# Patient Record
Sex: Female | Born: 1955
Health system: Southern US, Community
[De-identification: ages and names within clinical notes are randomized; demographics above are authoritative.]

## PROBLEM LIST (undated history)

## (undated) DIAGNOSIS — Z720 Tobacco use: Secondary | ICD-10-CM

## (undated) DIAGNOSIS — I219 Acute myocardial infarction, unspecified: Secondary | ICD-10-CM

## (undated) DIAGNOSIS — R55 Syncope and collapse: Secondary | ICD-10-CM

## (undated) DIAGNOSIS — E509 Vitamin A deficiency, unspecified: Secondary | ICD-10-CM

## (undated) DIAGNOSIS — R519 Headache, unspecified: Secondary | ICD-10-CM

## (undated) DIAGNOSIS — I2510 Atherosclerotic heart disease of native coronary artery without angina pectoris: Secondary | ICD-10-CM

## (undated) DIAGNOSIS — I10 Essential (primary) hypertension: Secondary | ICD-10-CM

## (undated) DIAGNOSIS — E785 Hyperlipidemia, unspecified: Secondary | ICD-10-CM

## (undated) DIAGNOSIS — M62838 Other muscle spasm: Secondary | ICD-10-CM

## (undated) HISTORY — DX: Essential (primary) hypertension: I10

## (undated) HISTORY — PX: BREAST SURGERY: SHX581

## (undated) HISTORY — DX: Hyperlipidemia, unspecified: E78.5

## (undated) HISTORY — DX: Atherosclerotic heart disease of native coronary artery without angina pectoris: I25.10

## (undated) HISTORY — PX: SHOULDER SURGERY: SHX246

## (undated) HISTORY — DX: Headache, unspecified: R51.9

## (undated) HISTORY — DX: Other muscle spasm: M62.838

## (undated) HISTORY — DX: Vitamin a deficiency, unspecified: E50.9

## (undated) HISTORY — DX: Acute myocardial infarction, unspecified: I21.9

## (undated) HISTORY — PX: TONSILLECTOMY: SUR1361

## (undated) HISTORY — DX: Tobacco use: Z72.0

## (undated) HISTORY — DX: Syncope and collapse: R55

## (undated) HISTORY — PX: APPENDECTOMY: SHX54

---

## 1981-11-20 HISTORY — PX: ABDOMINAL HYSTERECTOMY: SHX81

## 1993-11-20 HISTORY — PX: CHOLECYSTECTOMY: SHX55

## 2009-11-20 DIAGNOSIS — I219 Acute myocardial infarction, unspecified: Secondary | ICD-10-CM

## 2009-11-20 HISTORY — DX: Acute myocardial infarction, unspecified: I21.9

## 2017-11-29 ENCOUNTER — Ambulatory Visit: Payer: Self-pay | Admitting: Emergency Medicine

## 2017-11-29 VITALS — BP 156/102 | HR 77 | Temp 98.4°F | Resp 22 | Wt 264.0 lb

## 2017-11-29 DIAGNOSIS — Z760 Encounter for issue of repeat prescription: Secondary | ICD-10-CM

## 2017-11-29 DIAGNOSIS — I10 Essential (primary) hypertension: Secondary | ICD-10-CM

## 2017-11-29 DIAGNOSIS — I499 Cardiac arrhythmia, unspecified: Secondary | ICD-10-CM

## 2017-11-29 MED ORDER — METOPROLOL SUCCINATE ER 25 MG PO TB24: 25.0000 mg | ORAL_TABLET | Freq: Every day | ORAL | 0 refills | Status: DC

## 2017-11-29 MED ORDER — HYDROCHLOROTHIAZIDE 12.5 MG PO TABS: 12.5000 mg | ORAL_TABLET | Freq: Every day | ORAL | 0 refills | Status: DC

## 2017-11-29 MED ORDER — HYDROXYZINE HCL 50 MG PO TABS: 50.0000 mg | ORAL_TABLET | Freq: Three times a day (TID) | ORAL | 0 refills | Status: DC | PRN

## 2017-11-29 MED ORDER — LISINOPRIL 10 MG PO TABS: 10.0000 mg | ORAL_TABLET | Freq: Every day | ORAL | 0 refills | Status: DC

## 2017-11-29 NOTE — Patient Instructions (Signed)
Hypertension Hypertension is another name for high blood pressure. High blood pressure forces your heart to work harder to pump blood. This can cause problems over time. There are two numbers in a blood pressure reading. There is a top number (systolic) over a bottom number (diastolic). It is best to have a blood pressure below 120/80. Healthy choices can help lower your blood pressure. You may need medicine to help lower your blood pressure if:  Your blood pressure cannot be lowered with healthy choices.  Your blood pressure is higher than 130/80.  Follow these instructions at home: Eating and drinking  If directed, follow the DASH eating plan. This diet includes: ? Filling half of your plate at each meal with fruits and vegetables. ? Filling one quarter of your plate at each meal with whole grains. Whole grains include whole wheat pasta, brown rice, and whole grain bread. ? Eating or drinking low-fat dairy products, such as skim milk or low-fat yogurt. ? Filling one quarter of your plate at each meal with low-fat (lean) proteins. Low-fat proteins include fish, skinless chicken, eggs, beans, and tofu. ? Avoiding fatty meat, cured and processed meat, or chicken with skin. ? Avoiding premade or processed food.  Eat less than 1,500 mg of salt (sodium) a day.  Limit alcohol use to no more than 1 drink a day for nonpregnant women and 2 drinks a day for men. One drink equals 12 oz of beer, 5 oz of wine, or 1 oz of hard liquor. Lifestyle  Work with your doctor to stay at a healthy weight or to lose weight. Ask your doctor what the best weight is for you.  Get at least 30 minutes of exercise that causes your heart to beat faster (aerobic exercise) most days of the week. This may include walking, swimming, or biking.  Get at least 30 minutes of exercise that strengthens your muscles (resistance exercise) at least 3 days a week. This may include lifting weights or pilates.  Do not use any  products that contain nicotine or tobacco. This includes cigarettes and e-cigarettes. If you need help quitting, ask your doctor.  Check your blood pressure at home as told by your doctor.  Keep all follow-up visits as told by your doctor. This is important. Medicines  Take over-the-counter and prescription medicines only as told by your doctor. Follow directions carefully.  Do not skip doses of blood pressure medicine. The medicine does not work as well if you skip doses. Skipping doses also puts you at risk for problems.  Ask your doctor about side effects or reactions to medicines that you should watch for. Contact a doctor if:  You think you are having a reaction to the medicine you are taking.  You have headaches that keep coming back (recurring).  You feel dizzy.  You have swelling in your ankles.  You have trouble with your vision. Get help right away if:  You get a very bad headache.  You start to feel confused.  You feel weak or numb.  You feel faint.  You get very bad pain in your: ? Chest. ? Belly (abdomen).  You throw up (vomit) more than once.  You have trouble breathing. Summary  Hypertension is another name for high blood pressure.  Making healthy choices can help lower blood pressure. If your blood pressure cannot be controlled with healthy choices, you may need to take medicine. This information is not intended to replace advice given to you by your health care  provider. Make sure you discuss any questions you have with your health care provider. Document Released: 04/24/2008 Document Revised: 10/04/2016 Document Reviewed: 10/04/2016 Elsevier Interactive Patient Education  [employer]2018 [employer] Medicine Refill at the Emergency Department We have refilled your medicine today, but it is best for you to get refills through your primary health care provider's office. In the future, please plan ahead so you do not need to get refills from the emergency  department. If the medicine we refilled was a maintenance medicine, you may have received only enough to get you by until you are able to see your regular health care provider. This information is not intended to replace advice given to you by your health care provider. Make sure you discuss any questions you have with your health care provider. Document Released: 02/23/2004 Document Revised: 10/20/2016 Document Reviewed: 02/13/2014 Elsevier Interactive Patient Education  2018 [employer]

## 2017-11-29 NOTE — Progress Notes (Signed)
° °  [id number]  Arrival Time:    SUBJECTIVE:  Deanna Dudley is a 62 y.o. female who presents to [facility] for a medication refill. 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 She has an appointment with a new PCP at the end of January to establish for primary care, previous [place] in [place]. She is requesting her BP medications and hydroxazine. She reports to be well controlled on these medications without previous known complications. Has no acute symptoms and no acute complaints, comprehensive ROS negative.    Allergies  Allergen Reactions   Demerol [Meperidine]    Imitrex [Sumatriptan]    Morphine And Related       ROS: As per HPI, remainder of ROS negative.   OBJECTIVE:   Vitals:   11/29/17 1135  BP: (!) 156/102  Pulse: 77  Resp: (!) 22  Temp: 98.4 F (36.9 C)  TempSrc: Oral  SpO2: 96%  Weight: 264 lb (119.7 kg)     General appearance: alert; no distress Eyes: PERRL; EOMI; conjunctiva normal HENT: normocephalic; atraumatic;  oral mucosa normal Neck: supple, no jvd Lungs: clear to auscultation bilaterally Heart: regular rate and rhythm Abdomen: soft, non-tender;  Back: no CVA tenderness Extremities: no cyanosis or edema; symmetrical with no gross deformities Skin: warm and dry Neurologic: normal gait; grossly normal, cranial nerves II-XII grossly intact Psychological: alert and cooperative; normal mood and affect     ASSESSMENT & PLAN:  1. Medication refill   2. Essential hypertension   3. Irregular heart rhythm     Meds ordered this encounter  Medications   lisinopril (PRINIVIL,ZESTRIL) 10 MG tablet    Sig: Take 1 tablet (10 mg total) by mouth daily.    Dispense:  90 tablet    Refill:  0    Order Specific Question:   Supervising Provider    Answer:   Stacie GlazeJENKINS, JOHN E [[id number]]   metoprolol succinate (TOPROL-XL) 25 MG 24 hr tablet    Sig: Take 1 tablet (25 mg total) by mouth daily.    Dispense:  90 tablet     Refill:  0    Order Specific Question:   Supervising Provider    Answer:   Stacie GlazeJENKINS, JOHN E [[id number]]   hydrOXYzine (ATARAX/VISTARIL) 50 MG tablet    Sig: Take 1 tablet (50 mg total) by mouth 3 (three) times daily as needed.    Dispense:  30 tablet    Refill:  0    Order Specific Question:   Supervising Provider    Answer:   Stacie GlazeJENKINS, JOHN E [[id number]]   hydrochlorothiazide (HYDRODIURIL) 12.5 MG tablet    Sig: Take 1 tablet (12.5 mg total) by mouth daily.    Dispense:  90 tablet    Refill:  0    Order Specific Question:   Supervising Provider    Answer:   Stacie GlazeJENKINS, JOHN E [phone][[id number]]   Will provide a one time refill, recommend follow up with a PCP for further management, counseling to go to the ER if she develops concerning symptoms. Reviewed expectations re: course of current medical issues. Questions answered. Outlined signs and symptoms indicating need for more acute intervention. Patient verbalized understanding. After Visit Summary given.

## 2017-12-18 ENCOUNTER — Ambulatory Visit (INDEPENDENT_AMBULATORY_CARE_PROVIDER_SITE_OTHER): Payer: 59 | Admitting: Family Medicine

## 2017-12-18 ENCOUNTER — Encounter: Payer: Self-pay | Admitting: Family Medicine

## 2017-12-18 VITALS — BP 142/94 | HR 80 | Temp 97.9°F | Resp 16 | Wt 264.0 lb

## 2017-12-18 DIAGNOSIS — E669 Obesity, unspecified: Secondary | ICD-10-CM | POA: Diagnosis not present

## 2017-12-18 DIAGNOSIS — I10 Essential (primary) hypertension: Secondary | ICD-10-CM | POA: Diagnosis not present

## 2017-12-18 DIAGNOSIS — Z131 Encounter for screening for diabetes mellitus: Secondary | ICD-10-CM

## 2017-12-18 DIAGNOSIS — I252 Old myocardial infarction: Secondary | ICD-10-CM | POA: Diagnosis not present

## 2017-12-18 DIAGNOSIS — R9431 Abnormal electrocardiogram [ECG] [EKG]: Secondary | ICD-10-CM

## 2017-12-18 LAB — POCT URINALYSIS DIP (DEVICE)
BILIRUBIN URINE: NEGATIVE
Glucose, UA: NEGATIVE mg/dL
Hgb urine dipstick: NEGATIVE
Ketones, ur: NEGATIVE mg/dL
NITRITE: NEGATIVE
PH: 6 (ref 5.0–8.0)
PROTEIN: NEGATIVE mg/dL
Specific Gravity, Urine: 1.015 (ref 1.005–1.030)
UROBILINOGEN UA: 1 mg/dL (ref 0.0–1.0)

## 2017-12-18 LAB — POCT GLYCOSYLATED HEMOGLOBIN (HGB A1C): HEMOGLOBIN A1C: 5.5

## 2017-12-18 MED ORDER — LISINOPRIL 10 MG PO TABS: 10.0000 mg | ORAL_TABLET | Freq: Every day | ORAL | 1 refills | Status: DC

## 2017-12-18 MED ORDER — METOPROLOL SUCCINATE ER 25 MG PO TB24: 50.0000 mg | ORAL_TABLET | Freq: Every day | ORAL | 1 refills | Status: DC

## 2017-12-18 NOTE — Progress Notes (Signed)
Patient ID: Deanna Dudley, female    DOB: 06/28/1956, 62 y.o.   MRN: [id number]  PCP: Bing NeighborsHarris, Deanna Strickler Dudley, Deanna Dudley  Chief Complaint  Patient presents with  . Establish Care    Subjective:  HPI Deanna Dudley is a 62 y.o. female presents to establish care today. 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 She is an employee of [employer].  She is a Designer, jewelleryregistered nurse.  Patient is accompanied by her husband and they are both attempting to report patient'Dudley health history at the same time which takes it difficult to ascertain when certain health events occurred as they are disagreeing about certain time frames.  She reports a history of multiple cardiovascular complaints. She describes the most recent episode which occurred in 2018 in which she experienced chest pain, shortness of breath, and became tachycardic [place] [place] and after waiting a while she signed and left AMA prior to any testing being completed.  She reports that she had an echocardi[place] and [place] in [place] however is unaware of what the results were.  She reports a known history of left ventricle hypertrophy secondary to an MI in June 2011 in which she underwent a heart cath with no interventions.  At some point she reports that she was seen by neurologist who had suspected that she had multiple sclerosis although she opted not to have an MRI performed due to cost.  At present she is negative for any neuromuscular dysfunction.  Other chronic issues include uncontrolled hypertension.  She admits to noncompliance with antihypertensive medications.  She has been off her meds for over a year.  She recently went to an urgent care to get medications filled after checking her blood pressure and it was greater than 150/90.  She is currently managed on  metoprolol 50 mg once daily and  hydrochlorothiazide 12.5 mg daily.  She denies any headaches, recent chest pain, shortness of breath, dizziness, or recent syncopal episodes.  She [place] and will attempt to retrieve a copy of her medical records.  She is sedentary and does not adhere to a DASH diet.  Social History   Socioeconomic History  . Marital status: Married    Spouse name: Not on file  . Number of children: Not on file  . Years of education: Not on file  . Highest education level: Not on file  Social Needs  . Financial resource strain: Not on file  . Food insecurity - worry: Not on file  . Food insecurity - inability: Not on file  . Transportation needs - medical: Not on file  . Transportation needs - non-medical: Not on file  Occupational History  . Not on file  Tobacco Use  . Smoking status: Never Smoker  . Smokeless tobacco: Never Used  Substance and Sexual Activity  . Alcohol use: Yes  . Drug use: No  . Sexual activity: Not on file  Other Topics Concern  . Not on file  Social History Narrative  . Not on file    Family History  Problem Relation Age of Onset  . Stroke Mother   . Colon cancer Mother   . Lung cancer Mother   . Hypothyroidism Father   . Diabetes Father      Review of Systems  Constitutional: Positive for fatigue.  HENT:  Negative.   Respiratory: Negative.   Cardiovascular: Negative.   Gastrointestinal: Negative.   Genitourinary: Negative.   Musculoskeletal: Negative.   Neurological: Positive for dizziness.       History of syncopal episode less than a year ago.  Hematological: Negative.   Psychiatric/Behavioral: Negative.      Allergies  Allergen Reactions  . Morphine And Related Shortness Of Breath  . Statins Other (See Comments)    Muscle cramps  . Demerol [Meperidine]   . Xanax [Alprazolam]     Prior to Admission medications   Medication Sig Start Date End Date Taking?  Authorizing Provider  hydrochlorothiazide (HYDRODIURIL) 12.5 MG tablet Take 12.5 mg by mouth daily. 11/29/17   [provider]  hydrOXYzine (ATARAX/VISTARIL) 50 MG tablet Take 50 mg by mouth 3 (three) times daily. 11/29/17   [provider]  lisinopril (PRINIVIL,ZESTRIL) 10 MG tablet Take 10 mg by mouth daily. 11/29/17   [provider]  metoprolol succinate (TOPROL-XL) 25 MG 24 hr tablet Take 25 mg by mouth daily. 11/29/17   [provider]    Past Medical, Surgical Family and Social History reviewed and updated.    Objective:   Today'Dudley Vitals   12/18/17 0928  BP: (!) 142/94  Pulse: 80  Resp: 16  Temp: 97.9 F (36.6 C)  TempSrc: Oral  SpO2: 98%  Weight: 264 lb (119.7 kg)    Wt Readings from Last 3 Encounters:  12/18/17 264 lb (119.7 kg)   Physical Exam Constitutional: Patient appears well-developed and well-nourished. No distress. HENT: Normocephalic, atraumatic, External right and left ear normal. Oropharynx is clear and moist.  Eyes: Conjunctivae and EOM are normal. PERRLA, no scleral icterus. Neck: Normal ROM. Neck supple. No JVD. No tracheal deviation. No thyromegaly. CVS: RRR, S1/S2 +, no murmurs, no gallops, no carotid bruit.  Pulmonary: Effort and breath sounds normal, no stridor, rhonchi, wheezes, rales.  Abdominal: Soft. BS +, no distension, tenderness, rebound or guarding.  Musculoskeletal: Normal range of motion. No edema and no tenderness.  Lymphadenopathy: No lymphadenopathy noted, cervical, inguinal or axillary Neuro: Alert. Normal strength,  muscle tone, and coordination.  Skin: Skin is warm and dry. No rash noted. Not diaphoretic. No erythema. No pallor. Psychiatric: Normal mood and affect. Behavior, judgment, thought content normal.   Assessment & Plan:  1. Essential hypertension She has not taken medication today prior to office visit.  We will continue on the same regimen and have patient return for blood pressure recheck  and follow-up within 6 weeks. We have discussed target BP range and blood pressure goal. I have advised patient to check BP regularly and to call us back or report to clinic if the numbers are consistently higher than 140/90. We discussed the importance of compliance with medical therapy and DASH diet recommended, consequences of uncontrolled hypertension discussed.   2. Screening for diabetes mellitus, 5.5, normal will repeat in 12 months.  3. History of myocardial infarction-patient reported.  Recent abnormal EKG shows left axis deviation.  She reports a syncopal episode occurring less than a year ago with accompanying shortness of breath and tachycardia.  Will refer to cardiology for further evaluation and workup.  Patient has a history of medication noncompliance with antihypertensive medication.  4. Obesity (BMI 30-39.9) Goal BMI  <25. Encouraged efforts to reduce weight include engaging in physical activity as tolerated with goal of 150 minutes per week. Improve dietary choices and eat a meal regimen consistent with a Mediterranean or DASH diet. Reduce simple carbohydrates. Do  not skip meals and eat healthy snacks throughout the day to avoid over-eating at dinner. Set a goal weight loss that is achievable for you.   5. Abnormal EKG, EKG significant for normal sinus rhythm with left axis deviation.  Has reported history of left ventricle hypertrophy and MI.  Referring patient for second opinion and further eval to cardiology.  Meds ordered this encounter  Medications  . metoprolol succinate (TOPROL-XL) 25 MG 24 hr tablet    Sig: Take 2 tablets (50 mg total) by mouth daily.    Dispense:  90 tablet    Refill:  1    Order Specific Question:   Supervising Provider    Answer:   Quentin Angst [id number]  . lisinopril (PRINIVIL,ZESTRIL) 10 MG tablet    Sig: Take 1 tablet (10 mg total) by mouth daily.    Dispense:  90 tablet    Refill:  1    Order Specific Question:   Supervising Provider     Answer:   Quentin Angst [id number]    Orders Placed This Encounter  Procedures  . Comprehensive metabolic panel  . CBC with Differential  . Ambulatory referral to Cardiology  . POCT glycosylated hemoglobin (Hb A1C)  . POCT urinalysis dip (device)  . EKG 12-Lead     Return for follow-up in 6 weeks of hypertension and to address overdue health maintenance items.     A total of 60 minutes spent, greater than 50 % of this time was spent reviewing prior medical history, reviewing medications and indications of treatment, prior labs and diagnostic tests, discussing current plan of treatment, health promotion, and goals of treatment.   Godfrey Pick. Tiburcio Pea, MSN, Deanna Dudley-C [facility]  [address] [place], [place] [address] [phone]

## 2017-12-18 NOTE — Patient Instructions (Addendum)
I have increased your metoprolol to 50 mg once daily to help control palpitations.   I will follow-up with you by phone after receiving your labs to evaluate whether or not your diuretic therapy should continue his hydrochlorothiazide or if maybe we should transition of furosemide.    Schedule follow-up lab appointment for fasting lipid panel.  I will see back in office in 6 weeks to follow-up on blood pressure any other concerns.   I will refer you to cardiology.  Please expect a call from heart care through [facility] for a visit to establish care for cardiovascular disease.    Hypertension Hypertension, commonly called high blood pressure, is when the force of blood pumping through the arteries is too strong. The arteries are the blood vessels that carry blood from the heart throughout the body. Hypertension forces the heart to work harder to pump blood and may cause arteries to become narrow or stiff. Having untreated or uncontrolled hypertension can cause heart attacks, strokes, kidney disease, and other problems. A blood pressure reading consists of a higher number over a lower number. Ideally, your blood pressure should be below 120/80. The first ("top") number is called the systolic pressure. It is a measure of the pressure in your arteries as your heart beats. The second ("bottom") number is called the diastolic pressure. It is a measure of the pressure in your arteries as the heart relaxes. What are the causes? The cause of this condition is not known. What increases the risk? Some risk factors for high blood pressure are under your control. Others are not. Factors you can change  Smoking.  Having type 2 diabetes mellitus, high cholesterol, or both.  Not getting enough exercise or physical activity.  Being overweight.  Having too much fat, sugar, calories, or salt (sodium) in your diet.  Drinking too much alcohol. Factors that are difficult or impossible to change  Having  chronic kidney disease.  Having a family history of high blood pressure.  Age. Risk increases with age.  Race. You may be at higher risk if you are African-American.  Gender. Men are at higher risk than women before age 58. After age 71, women are at higher risk than men.  Having obstructive sleep apnea.  Stress. What are the signs or symptoms? Extremely high blood pressure (hypertensive crisis) may cause:  Headache.  Anxiety.  Shortness of breath.  Nosebleed.  Nausea and vomiting.  Severe chest pain.  Jerky movements you cannot control (seizures).  How is this diagnosed? This condition is diagnosed by measuring your blood pressure while you are seated, with your arm resting on a surface. The cuff of the blood pressure monitor will be placed directly against the skin of your upper arm at the level of your heart. It should be measured at least twice using the same arm. Certain conditions can cause a difference in blood pressure between your right and left arms. Certain factors can cause blood pressure readings to be lower or higher than normal (elevated) for a short period of time:  When your blood pressure is higher when you are in a health care provider's office than when you are at home, this is called white coat hypertension. Most people with this condition do not need medicines.  When your blood pressure is higher at home than when you are in a health care provider's office, this is called masked hypertension. Most people with this condition may need medicines to control blood pressure.  If you have  a high blood pressure reading during one visit or you have normal blood pressure with other risk factors:  You may be asked to return on a different day to have your blood pressure checked again.  You may be asked to monitor your blood pressure at home for 1 week or longer.  If you are diagnosed with hypertension, you may have other blood or imaging tests to help your  health care provider understand your overall risk for other conditions. How is this treated? This condition is treated by making healthy lifestyle changes, such as eating healthy foods, exercising more, and reducing your alcohol intake. Your health care provider may prescribe medicine if lifestyle changes are not enough to get your blood pressure under control, and if:  Your systolic blood pressure is above 130.  Your diastolic blood pressure is above 80.  Your personal target blood pressure may vary depending on your medical conditions, your age, and other factors. Follow these instructions at home: Eating and drinking  Eat a diet that is high in fiber and potassium, and low in sodium, added sugar, and fat. An example eating plan is called the DASH (Dietary Approaches to Stop Hypertension) diet. To eat this way: ? Eat plenty of fresh fruits and vegetables. Try to fill half of your plate at each meal with fruits and vegetables. ? Eat whole grains, such as whole wheat pasta, brown rice, or whole grain bread. Fill about one quarter of your plate with whole grains. ? Eat or drink low-fat dairy products, such as skim milk or low-fat yogurt. ? Avoid fatty cuts of meat, processed or cured meats, and poultry with skin. Fill about one quarter of your plate with lean proteins, such as fish, chicken without skin, beans, eggs, and tofu. ? Avoid premade and processed foods. These tend to be higher in sodium, added sugar, and fat.  Reduce your daily sodium intake. Most people with hypertension should eat less than 1,500 mg of sodium a day.  Limit alcohol intake to no more than 1 drink a day for nonpregnant women and 2 drinks a day for men. One drink equals 12 oz of beer, 5 oz of wine, or 1 oz of hard liquor. Lifestyle  Work with your health care provider to maintain a healthy body weight or to lose weight. Ask what an ideal weight is for you.  Get at least 30 minutes of exercise that causes your heart  to beat faster (aerobic exercise) most days of the week. Activities may include walking, swimming, or biking.  Include exercise to strengthen your muscles (resistance exercise), such as pilates or lifting weights, as part of your weekly exercise routine. Try to do these types of exercises for 30 minutes at least 3 days a week.  Do not use any products that contain nicotine or tobacco, such as cigarettes and e-cigarettes. If you need help quitting, ask your health care provider.  Monitor your blood pressure at home as told by your health care provider.  Keep all follow-up visits as told by your health care provider. This is important. Medicines  Take over-the-counter and prescription medicines only as told by your health care provider. Follow directions carefully. Blood pressure medicines must be taken as prescribed.  Do not skip doses of blood pressure medicine. Doing this puts you at risk for problems and can make the medicine less effective.  Ask your health care provider about side effects or reactions to medicines that you should watch for. Contact a health care  provider if:  You think you are having a reaction to a medicine you are taking.  You have headaches that keep coming back (recurring).  You feel dizzy.  You have swelling in your ankles.  You have trouble with your vision. Get help right away if:  You develop a severe headache or confusion.  You have unusual weakness or numbness.  You feel faint.  You have severe pain in your chest or abdomen.  You vomit repeatedly.  You have trouble breathing. Summary  Hypertension is when the force of blood pumping through your arteries is too strong. If this condition is not controlled, it may put you at risk for serious complications.  Your personal target blood pressure may vary depending on your medical conditions, your age, and other factors. For most people, a normal blood pressure is less than 120/80.  Hypertension  is treated with lifestyle changes, medicines, or a combination of both. Lifestyle changes include weight loss, eating a healthy, low-sodium diet, exercising more, and limiting alcohol. This information is not intended to replace advice given to you by your health care provider. Make sure you discuss any questions you have with your health care provider. Document Released: 11/06/2005 Document Revised: 10/04/2016 Document Reviewed: 10/04/2016 Elsevier Interactive Patient Education  [employer]2018 [employer] Inc.

## 2017-12-19 LAB — COMPREHENSIVE METABOLIC PANEL
ALBUMIN: 4.3 g/dL (ref 3.6–4.8)
ALT: 13 IU/L (ref 0–32)
AST: 11 IU/L (ref 0–40)
Albumin/Globulin Ratio: 1.7 (ref 1.2–2.2)
Alkaline Phosphatase: 63 IU/L (ref 39–117)
BUN/Creatinine Ratio: 19 (ref 12–28)
BUN: 15 mg/dL (ref 8–27)
Bilirubin Total: 0.2 mg/dL (ref 0.0–1.2)
CALCIUM: 9.2 mg/dL (ref 8.7–10.3)
CHLORIDE: 104 mmol/L (ref 96–106)
CO2: 20 mmol/L (ref 20–29)
CREATININE: 0.8 mg/dL (ref 0.57–1.00)
GFR, EST AFRICAN AMERICAN: 92 mL/min/{1.73_m2} (ref >59)
GFR, EST NON AFRICAN AMERICAN: 80 mL/min/{1.73_m2} (ref >59)
Globulin, Total: 2.5 g/dL (ref 1.5–4.5)
Glucose: 89 mg/dL (ref 65–99)
Potassium: 3.9 mmol/L (ref 3.5–5.2)
Sodium: 143 mmol/L (ref 134–144)
TOTAL PROTEIN: 6.8 g/dL (ref 6.0–8.5)

## 2017-12-19 LAB — CBC WITH DIFFERENTIAL/PLATELET
BASOS ABS: 0.1 10*3/uL (ref 0.0–0.2)
BASOS: 1 %
EOS (ABSOLUTE): 0.4 10*3/uL (ref 0.0–0.4)
EOS: 4 %
HEMATOCRIT: 42.1 % (ref 34.0–46.6)
HEMOGLOBIN: 14.6 g/dL (ref 11.1–15.9)
IMMATURE GRANS (ABS): 0 10*3/uL (ref 0.0–0.1)
Immature Granulocytes: 0 %
LYMPHS ABS: 3.3 10*3/uL — AB (ref 0.7–3.1)
LYMPHS: 29 %
MCH: 31.2 pg (ref 26.6–33.0)
MCHC: 34.7 g/dL (ref 31.5–35.7)
MCV: 90 fL (ref 79–97)
MONOCYTES: 6 %
Monocytes Absolute: 0.6 10*3/uL (ref 0.1–0.9)
NEUTROS ABS: 6.8 10*3/uL (ref 1.4–7.0)
Neutrophils: 60 %
Platelets: 253 10*3/uL (ref 150–379)
RBC: 4.68 x10E6/uL (ref 3.77–5.28)
RDW: 13.8 % (ref 12.3–15.4)
WBC: 11.3 10*3/uL — ABNORMAL HIGH (ref 3.4–10.8)

## 2017-12-25 ENCOUNTER — Other Ambulatory Visit: Payer: Self-pay | Admitting: Family Medicine

## 2017-12-25 MED ORDER — METOPROLOL SUCCINATE ER 50 MG PO TB24: 50.0000 mg | ORAL_TABLET | Freq: Every day | ORAL | 1 refills | Status: DC

## 2017-12-25 MED FILL — METOPROLOL SUCC ER 50 MG TA: 50 | 90 days supply | Qty: 90 | Fill #0

## 2017-12-25 MED FILL — LISINOPRIL 10 MG TABLET: 10 | 90 days supply | Qty: 90 | Fill #0

## 2017-12-25 NOTE — Progress Notes (Signed)
Recent prescription for [facility]oprolol 50 mg once daily to [facility], patient wishes to obtain prescriptions from the pharmacy.

## 2017-12-31 ENCOUNTER — Telehealth: Payer: Self-pay

## 2017-12-31 NOTE — Telephone Encounter (Signed)
-----   Message from Bing NeighborsKimberly S Harris, FNP sent at 12/25/2017  7:45 AM EST ----- Contact patient to document her height. This information was not documented in the chart.

## 2017-12-31 NOTE — Telephone Encounter (Signed)
Left a vm for patient to callback 

## 2018-01-02 ENCOUNTER — Encounter: Payer: Self-pay | Admitting: Family Medicine

## 2018-01-02 ENCOUNTER — Ambulatory Visit (INDEPENDENT_AMBULATORY_CARE_PROVIDER_SITE_OTHER): Payer: 59 | Admitting: Family Medicine

## 2018-01-02 VITALS — BP 142/78 | HR 70 | Temp 97.4°F | Resp 14 | Ht 65.0 in | Wt 264.0 lb

## 2018-01-02 DIAGNOSIS — M5442 Lumbago with sciatica, left side: Secondary | ICD-10-CM

## 2018-01-02 DIAGNOSIS — Z136 Encounter for screening for cardiovascular disorders: Secondary | ICD-10-CM | POA: Diagnosis not present

## 2018-01-02 DIAGNOSIS — M5441 Lumbago with sciatica, right side: Secondary | ICD-10-CM

## 2018-01-02 LAB — POCT URINALYSIS DIP (DEVICE)
BILIRUBIN URINE: NEGATIVE
Glucose, UA: NEGATIVE mg/dL
Ketones, ur: NEGATIVE mg/dL
NITRITE: NEGATIVE
PH: 5.5 (ref 5.0–8.0)
Protein, ur: NEGATIVE mg/dL
Specific Gravity, Urine: <=1.005 (ref 1.005–1.030)
UROBILINOGEN UA: 0.2 mg/dL (ref 0.0–1.0)

## 2018-01-02 MED ORDER — PREDNISONE 20 MG PO TABS: 60.0000 mg | ORAL_TABLET | Freq: Every day | ORAL | 0 refills | Status: DC

## 2018-01-02 MED ORDER — LISINOPRIL 20 MG PO TABS: 20.0000 mg | ORAL_TABLET | Freq: Every day | ORAL | 3 refills | Status: DC

## 2018-01-02 MED ORDER — CYCLOBENZAPRINE HCL 10 MG PO TABS: 10.0000 mg | ORAL_TABLET | Freq: Three times a day (TID) | ORAL | 0 refills | Status: DC | PRN

## 2018-01-02 MED FILL — CYCLOBENZAPRINE 10 MG TAB: 10 | 10 days supply | Qty: 30 | Fill #0

## 2018-01-02 MED FILL — predniSONE 20 MG TABS: 20 | 5 days supply | Qty: 15 | Fill #0
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
## 2018-01-02 NOTE — Patient Instructions (Signed)
I am increasing your lisinopril 20 mg once daily.   For your sciatic back pain, I am prescribing you prednisone 60 mg once daily x 5 days with meals,  For muscle spasms, I am starting you on cyclobenzaprine 10 mg once daily up to 3 times daily as needed.-medication may cause drowsiness.     Sciatica Sciatica is pain, numbness, weakness, or tingling along the path of the sciatic nerve. The sciatic nerve starts in the lower back and runs down the back of each leg. The nerve controls the muscles in the lower leg and in the back of the knee. It also provides feeling (sensation) to the back of the thigh, the lower leg, and the sole of the foot. Sciatica is a symptom of another medical condition that pinches or puts pressure on the sciatic nerve. Generally, sciatica only affects one side of the body. Sciatica usually goes away on its own or with treatment. In some cases, sciatica may keep coming back (recur). What are the causes? This condition is caused by pressure on the sciatic nerve, or pinching of the sciatic nerve. This may be the result of:  A disk in between the bones of the spine (vertebrae) bulging out too far (herniated disk).  Age-related changes in the spinal disks (degenerative disk disease).  A pain disorder that affects a muscle in the buttock (piriformis syndrome).  Extra bone growth (bone spur) near the sciatic nerve.  An injury or break (fracture) of the pelvis.  Pregnancy.  Tumor (rare).  What increases the risk? The following factors may make you more likely to develop this condition:  Playing sports that place pressure or stress on the spine, such as football or weight lifting.  Having poor strength and flexibility.  A history of back injury.  A history of back surgery.  Sitting for long periods of time.  Doing activities that involve repetitive bending or lifting.  Obesity.  What are the signs or symptoms? Symptoms can vary from mild to very severe, and  they may include:  Any of these problems in the lower back, leg, hip, or buttock: ? Mild tingling or dull aches. ? Burning sensations. ? Sharp pains.  Numbness in the back of the calf or the sole of the foot.  Leg weakness.  Severe back pain that makes movement difficult.  These symptoms may get worse when you cough, sneeze, or laugh, or when you sit or stand for long periods of time. Being overweight may also make symptoms worse. In some cases, symptoms may recur over time. How is this diagnosed? This condition may be diagnosed based on:  Your symptoms.  A physical exam. Your health care provider may ask you to do certain movements to check whether those movements trigger your symptoms.  You may have tests, including: ? Blood tests. ? X-rays. ? MRI. ? CT scan.  How is this treated? In many cases, this condition improves on its own, without any treatment. However, treatment may include:  Reducing or modifying physical activity during periods of pain.  Exercising and stretching to strengthen your abdomen and improve the flexibility of your spine.  Icing and applying heat to the affected area.  Medicines that help: ? To relieve pain and swelling. ? To relax your muscles.  Injections of medicines that help to relieve pain, irritation, and inflammation around the sciatic nerve (steroids).  Surgery.  Follow these instructions at home: Medicines  Take over-the-counter and prescription medicines only as told by your health care  provider.  Do not drive or operate heavy machinery while taking prescription pain medicine. Managing pain  If directed, apply ice to the affected area. ? Put ice in a plastic bag. ? Place a towel between your skin and the bag. ? Leave the ice on for 20 minutes, 2-3 times a day.  After icing, apply heat to the affected area before you exercise or as often as told by your health care provider. Use the heat source that your health care provider  recommends, such as a moist heat pack or a heating pad. ? Place a towel between your skin and the heat source. ? Leave the heat on for 20-30 minutes. ? Remove the heat if your skin turns bright red. This is especially important if you are unable to feel pain, heat, or cold. You may have a greater risk of getting burned. Activity  Return to your normal activities as told by your health care provider. Ask your health care provider what activities are safe for you. ? Avoid activities that make your symptoms worse.  Take brief periods of rest throughout the day. Resting in a lying or standing position is usually better than sitting to rest. ? When you rest for longer periods, mix in some mild activity or stretching between periods of rest. This will help to prevent stiffness and pain. ? Avoid sitting for long periods of time without moving. Get up and move around at least one time each hour.  Exercise and stretch regularly, as told by your health care provider.  Do not lift anything that is heavier than 10 lb (4.5 kg) while you have symptoms of sciatica. When you do not have symptoms, you should still avoid heavy lifting, especially repetitive heavy lifting.  When you lift objects, always use proper lifting technique, which includes: ? Bending your knees. ? Keeping the load close to your body. ? Avoiding twisting. General instructions  Use good posture. ? Avoid leaning forward while sitting. ? Avoid hunching over while standing.  Maintain a healthy weight. Excess weight puts extra stress on your back and makes it difficult to maintain good posture.  Wear supportive, comfortable shoes. Avoid wearing high heels.  Avoid sleeping on a mattress that is too soft or too hard. A mattress that is firm enough to support your back when you sleep may help to reduce your pain.  Keep all follow-up visits as told by your health care provider. This is important. Contact a health care provider  if:  You have pain that wakes you up when you are sleeping.  You have pain that gets worse when you lie down.  Your pain is worse than you have experienced in the past.  Your pain lasts longer than 4 weeks.  You experience unexplained weight loss. Get help right away if:  You lose control of your bowel or bladder (incontinence).  You have: ? Weakness in your lower back, pelvis, buttocks, or legs that gets worse. ? Redness or swelling of your back. ? A burning sensation when you urinate. This information is not intended to replace advice given to you by your health care provider. Make sure you discuss any questions you have with your health care provider. Document Released: 10/31/2001 Document Revised: 04/11/2016 Document Reviewed: 07/16/2015 Elsevier Interactive Patient Education  [employer]2018 [employer] Inc.

## 2018-01-03 LAB — LIPID PANEL
CHOL/HDL RATIO: 5.5 ratio — AB (ref 0.0–4.4)
Cholesterol, Total: 182 mg/dL (ref 100–199)
HDL: 33 mg/dL — ABNORMAL LOW (ref >39)
LDL Calculated: 115 mg/dL — ABNORMAL HIGH (ref 0–99)
Triglycerides: 168 mg/dL — ABNORMAL HIGH (ref 0–149)
VLDL Cholesterol Cal: 34 mg/dL (ref 5–40)

## 2018-01-14 ENCOUNTER — Telehealth: Payer: Self-pay | Admitting: Family Medicine

## 2018-01-14 MED ORDER — GEMFIBROZIL 600 MG PO TABS: 600.0000 mg | ORAL_TABLET | Freq: Two times a day (BID) | ORAL | 1 refills | Status: DC

## 2018-01-14 NOTE — Telephone Encounter (Signed)
Phone line busy, can we send a letter

## 2018-01-14 NOTE — Telephone Encounter (Signed)
Contact patient to advise that her bad cholesterol and trigycerides were both elevated. Good cholesterol (HDL) is not at goal.  I would like to start her on Lopid 600 mg twice  Daily as she can't tolerate statin therapy. Encourage a Dash Diet along with increasing physical activity to improve overall cardiovascular health.   Godfrey PickKimberly S. Tiburcio PeaHarris, MSN, FNP-C [facility]  [address] [place], [place] [address] [phone]

## 2018-01-29 ENCOUNTER — Telehealth: Payer: Self-pay | Admitting: Family Medicine

## 2018-01-29 ENCOUNTER — Encounter: Payer: Self-pay | Admitting: Family Medicine

## 2018-01-29 ENCOUNTER — Ambulatory Visit (INDEPENDENT_AMBULATORY_CARE_PROVIDER_SITE_OTHER): Payer: 59 | Admitting: Family Medicine

## 2018-01-29 VITALS — BP 126/88 | HR 94 | Temp 98.0°F | Ht 65.0 in | Wt 257.0 lb

## 2018-01-29 DIAGNOSIS — Z87820 Personal history of traumatic brain injury: Secondary | ICD-10-CM

## 2018-01-29 DIAGNOSIS — M5432 Sciatica, left side: Secondary | ICD-10-CM

## 2018-01-29 DIAGNOSIS — R252 Cramp and spasm: Secondary | ICD-10-CM | POA: Diagnosis not present

## 2018-01-29 DIAGNOSIS — H9319 Tinnitus, unspecified ear: Secondary | ICD-10-CM

## 2018-01-29 DIAGNOSIS — M5431 Sciatica, right side: Secondary | ICD-10-CM

## 2018-01-29 DIAGNOSIS — R82998 Other abnormal findings in urine: Secondary | ICD-10-CM

## 2018-01-29 LAB — POCT URINALYSIS DIP (DEVICE)
Glucose, UA: NEGATIVE mg/dL
HGB URINE DIPSTICK: NEGATIVE
KETONES UR: 15 mg/dL — AB
NITRITE: NEGATIVE
Protein, ur: NEGATIVE mg/dL
SPECIFIC GRAVITY, URINE: 1.025 (ref 1.005–1.030)
Urobilinogen, UA: 0.2 mg/dL (ref 0.0–1.0)
pH: 5 (ref 5.0–8.0)

## 2018-01-29 MED ORDER — KETOROLAC TROMETHAMINE 30 MG/ML IJ SOLN
30.0000 mg | Freq: Once | INTRAMUSCULAR | Status: AC
  Administered 2018-01-29: 30 mg via INTRAMUSCULAR

## 2018-01-29 MED ORDER — HYDROXYZINE HCL 50 MG PO TABS: 50.0000 mg | ORAL_TABLET | Freq: Three times a day (TID) | ORAL | 2 refills | Status: DC

## 2018-01-29 MED ORDER — PREDNISONE 20 MG PO TABS: ORAL_TABLET | ORAL | 0 refills | Status: DC

## 2018-01-29 MED ORDER — HYDROCHLOROTHIAZIDE 12.5 MG PO TABS: 12.5000 mg | ORAL_TABLET | Freq: Every day | ORAL | 1 refills | Status: DC

## 2018-01-29 MED ORDER — DEXAMETHASONE SODIUM PHOSPHATE 10 MG/ML IJ SOLN
20.0000 mg | Freq: Once | INTRAMUSCULAR | Status: AC
  Administered 2018-01-29: 20 mg via INTRAMUSCULAR

## 2018-01-29 MED FILL — predniSONE 20 MG TABS: 20 | 9 days supply | Qty: 18 | Fill #0

## 2018-01-29 MED FILL — HYDROCHLOROTHIAZIDE 12.5 MG: 12.5 | 90 days supply | Qty: 90 | Fill #0

## 2018-01-29 MED FILL — hydrOXYzine HCL 50 MG TABS: 50 | 30 days supply | Qty: 90 | Fill #0

## 2018-01-29 NOTE — Telephone Encounter (Signed)
This has been scheduled for 01/31/2018@11am . Office had tried to contact patient however phone number in chart was incorrect. This has been updated. Thanks!

## 2018-01-29 NOTE — Telephone Encounter (Signed)
Vernona RiegerLaura,  Could you contact cardiology regarding the referral placed on 12/25/2017. I do not see that the patient has been scheduled for an appointment.   Godfrey PickKimberly S. Tiburcio PeaHarris, MSN, FNP-C [facility]  [address] [place], [place] [address] [phone]

## 2018-01-29 NOTE — Progress Notes (Signed)
Patient ID: Deanna Dudley Pavlock, female    DOB: 04/09/1956, 62 y.o.   MRN: [id number]  PCP: Bing NeighborsHarris, Julisa Flippo S, FNP  Chief Complaint  Patient presents with  . Follow-up    Htn  . Spasms    Subjective:  HPI Deanna Dudley Lantz is a 62 y.o. female with hypertension and muscle spasm, presents for evaluation of hypertension and new complaint of generalized body spasms. Reports body spasms are becoming concerning. Spasms are occurring on bilateral flanks, legs, below the rib cage. When cramps occur she can't move. The spasms are lasting 5-10 minutes.  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 She couldn't move for over 10 minutes. She characterizes pain as cramping and spasmatic. Attempted relief with muscle relaxer which relieves symptoms temporarily. She reports adherence with blood pressure medication. She has not routinely monitoring blood pressure. She is still waiting to follow-up with cardiology regarding intermittent chest pain. She continues to be inactive of physical activity. Denies headaches, dizziness, weakness, or shortness of breath.  Social History   Socioeconomic History  . Marital status: Married    Spouse name: Not on file  . Number of children: Not on file  . Years of education: Not on file  . Highest education level: Not on file  Social Needs  . Financial resource strain: Not on file  . Food insecurity - worry: Not on file  . Food insecurity - inability: Not on file  . Transportation needs - medical: Not on file  . Transportation needs - non-medical: Not on file  Occupational History  . Not on file  Tobacco Use  . Smoking status: Never Smoker  . Smokeless tobacco: Never Used  Substance and Sexual Activity  . Alcohol use: Yes  . Drug use: No  . Sexual activity: Not on file  Other Topics Concern  . Not on file  Social History Narrative  . Not on file    Family History  Problem Relation Age of Onset  . Stroke Mother   . Colon cancer Mother   .  Lung cancer Mother   . Hypothyroidism Father   . Diabetes Father    Review of Systems  Pertinent negatives indicated in HPI   Allergies  Allergen Reactions  . Morphine And Related Shortness Of Breath  . Statins Other (See Comments)    Muscle cramps  . Demerol [Meperidine]   . Xanax [Alprazolam]     Prior to Admission medications   Medication Sig Start Date End Date Taking? Authorizing Provider  aspirin 81 MG chewable tablet Chew by mouth daily.   Yes [provider]  cyclobenzaprine (FLEXERIL) 10 MG tablet Take 1 tablet (10 mg total) by mouth 3 (three) times daily as needed for muscle spasms. 01/02/18  Yes Bing NeighborsHarris, Brian Kocourek S, FNP  gemfibrozil (LOPID) 600 MG tablet Take 1 tablet (600 mg total) by mouth 2 (two) times daily before a meal. 01/14/18  Yes Bing NeighborsHarris, Finnbar Cedillos S, FNP  hydrochlorothiazide (HYDRODIURIL) 12.5 MG tablet Take 12.5 mg by mouth daily. 11/29/17  Yes [provider]  hydrOXYzine (ATARAX/VISTARIL) 50 MG tablet Take 50 mg by mouth 3 (three) times daily. 11/29/17  Yes [provider]  lisinopril (PRINIVIL,ZESTRIL) 20 MG tablet Take 1 tablet (20 mg total) by mouth daily. 01/02/18  Yes Bing NeighborsHarris, Camaria Gerald S, FNP  metoprolol succinate (TOPROL-XL) 50 MG 24 hr tablet Take 1 tablet (50 mg total) by mouth daily. 12/25/17  Yes Bing NeighborsHarris, Isacc Turney S, FNP  predniSONE (DELTASONE) 20 MG tablet Take  3 tablets (60 mg total) by mouth daily with breakfast. Patient not taking: Reported on 01/29/2018 01/02/18   Bing Neighbors, FNP    Past Medical, Surgical Family and Social History reviewed and updated.    Objective:   Today's Vitals   01/29/18 1019  BP: 126/88  Pulse: 94  Temp: 98 F (36.7 C)  TempSrc: Oral  SpO2: 99%  Weight: 257 lb (116.6 kg)  Height: 5\' 5"  (1.651 m)    Wt Readings from Last 3 Encounters:  01/29/18 257 lb (116.6 kg)  01/02/18 264 lb (119.7 kg)  12/18/17 264 lb (119.7 kg)   Physical Exam  Constitutional: She appears well-developed and  well-nourished.  HENT:  Head: Normocephalic and atraumatic.  Eyes: Pupils are equal, round, and reactive to light. Conjunctivae and EOM are normal.  Neck: Normal range of motion. Neck supple.  Cardiovascular: Normal rate, regular rhythm, normal heart sounds and intact distal pulses.  Pulmonary/Chest: Effort normal and breath sounds normal.  Musculoskeletal: She exhibits tenderness. She exhibits no edema.       Lumbar back: She exhibits spasm.  Neurological: Coordination normal.  Skin: Skin is warm and dry.  Psychiatric: Her mood appears anxious.   Assessment & Plan:  1. Bilateral sciatica, patient had an episode of spasmatic pain while leaving the office and escorted back into the exam room. Heat was applied to her back directly to area she experienced the pain. She was administered subcutaneous ketorolac and dexamethasone . Pain was relieved and patient was able to ambulate independently without assistance out of the office.  2. Ringing in ears, unspecified laterality 3. History of closed head injury, previously followed by neurology in [place] although relocated prior to receiving MRI. Continues to experience tinnitus and suspects this is related to head injury. Will refer- Ambulatory referral to Neurology for further work-up and evaluation   4. Spasm, unknown etiology. Checking  Vitamin D, 25-hydroxy, Vitamin B-12, Comprehensive metabolic panel, Sedimentation Rate, to rule out metabolic source of spasms. Referring to Neurology. - ketorolac (TORADOL) 30 MG/ML injection 30 mg - dexamethasone (DECADRON) injection 20 mg  5. Leukocytes in urine, check urine culture    Meds ordered this encounter  Medications  . DISCONTD: hydrochlorothiazide (HYDRODIURIL) 12.5 MG tablet    Sig: Take 1 tablet (12.5 mg total) by mouth daily.    Dispense:  90 tablet    Refill:  1    Order Specific Question:   Supervising Provider    Answer:   Quentin Angst [id number]  . hydrOXYzine  (ATARAX/VISTARIL) 50 MG tablet    Sig: Take 1 tablet (50 mg total) by mouth 3 (three) times daily.    Dispense:  90 tablet    Refill:  2    Order Specific Question:   Supervising Provider    Answer:   Quentin Angst [id number]  . predniSONE (DELTASONE) 20 MG tablet    Sig: Take 3 PO QAM x3days, 2 PO QAM x3days, 1 PO QAM x3days    Dispense:  18 tablet    Refill:  0    Order Specific Question:   Supervising Provider    Answer:   Quentin Angst [id number]  . ketorolac (TORADOL) 30 MG/ML injection 30 mg  . dexamethasone (DECADRON) injection 20 mg   Orders Placed This Encounter  Procedures  . Urine Culture  . DG Lumbar Spine Complete  . Vitamin D, 25-hydroxy  . Vitamin B12  . Comprehensive metabolic panel  . Sedimentation Rate  . Ambulatory  referral to Neurology  . POCT urinalysis dip (device)    RTC: 3 month for chronic condition follow-up.     Godfrey Pick. Tiburcio Pea, MSN, FNP-C [facility]  [address] [place], [place] [address] [phone]

## 2018-01-30 DIAGNOSIS — I10 Essential (primary) hypertension: Secondary | ICD-10-CM | POA: Insufficient documentation

## 2018-01-30 DIAGNOSIS — M62838 Other muscle spasm: Secondary | ICD-10-CM | POA: Insufficient documentation

## 2018-01-30 LAB — COMPREHENSIVE METABOLIC PANEL
ALT: 29 IU/L (ref 0–32)
AST: 20 IU/L (ref 0–40)
Albumin/Globulin Ratio: 1.5 (ref 1.2–2.2)
Albumin: 4.5 g/dL (ref 3.6–4.8)
Alkaline Phosphatase: 63 IU/L (ref 39–117)
BUN/Creatinine Ratio: 21 (ref 12–28)
BUN: 18 mg/dL (ref 8–27)
Bilirubin Total: 0.3 mg/dL (ref 0.0–1.2)
CALCIUM: 10.1 mg/dL (ref 8.7–10.3)
CO2: 20 mmol/L (ref 20–29)
Chloride: 100 mmol/L (ref 96–106)
Creatinine, Ser: 0.87 mg/dL (ref 0.57–1.00)
GFR calc Af Amer: 83 mL/min/{1.73_m2} (ref >59)
GFR, EST NON AFRICAN AMERICAN: 72 mL/min/{1.73_m2} (ref >59)
Globulin, Total: 3.1 g/dL (ref 1.5–4.5)
Glucose: 93 mg/dL (ref 65–99)
POTASSIUM: 4.9 mmol/L (ref 3.5–5.2)
Sodium: 138 mmol/L (ref 134–144)
Total Protein: 7.6 g/dL (ref 6.0–8.5)

## 2018-01-30 LAB — SEDIMENTATION RATE: SED RATE: 10 mm/h (ref 0–40)

## 2018-01-30 LAB — VITAMIN D 25 HYDROXY (VIT D DEFICIENCY, FRACTURES): VIT D 25 HYDROXY: 15.7 ng/mL — AB (ref 30.0–100.0)

## 2018-01-30 LAB — VITAMIN B12: Vitamin B-12: 930 pg/mL (ref 232–1245)

## 2018-01-31 ENCOUNTER — Encounter: Payer: Self-pay | Admitting: Physician Assistant

## 2018-01-31 ENCOUNTER — Ambulatory Visit (INDEPENDENT_AMBULATORY_CARE_PROVIDER_SITE_OTHER): Payer: 59 | Admitting: Physician Assistant

## 2018-01-31 ENCOUNTER — Other Ambulatory Visit: Payer: Self-pay | Admitting: Family Medicine

## 2018-01-31 VITALS — BP 112/86 | HR 84 | Ht 65.0 in | Wt 257.0 lb

## 2018-01-31 DIAGNOSIS — E785 Hyperlipidemia, unspecified: Secondary | ICD-10-CM

## 2018-01-31 DIAGNOSIS — Z01812 Encounter for preprocedural laboratory examination: Secondary | ICD-10-CM

## 2018-01-31 DIAGNOSIS — I200 Unstable angina: Secondary | ICD-10-CM

## 2018-01-31 DIAGNOSIS — I5031 Acute diastolic (congestive) heart failure: Secondary | ICD-10-CM | POA: Diagnosis not present

## 2018-01-31 DIAGNOSIS — R0683 Snoring: Secondary | ICD-10-CM | POA: Diagnosis not present

## 2018-01-31 DIAGNOSIS — I10 Essential (primary) hypertension: Secondary | ICD-10-CM | POA: Diagnosis not present

## 2018-01-31 DIAGNOSIS — Z720 Tobacco use: Secondary | ICD-10-CM

## 2018-01-31 LAB — URINE CULTURE

## 2018-01-31 MED ORDER — ROSUVASTATIN CALCIUM 10 MG PO TABS: 10.0000 mg | ORAL_TABLET | Freq: Every day | ORAL | 3 refills | Status: DC

## 2018-01-31 MED ORDER — HYDROCHLOROTHIAZIDE 25 MG PO TABS: 25.0000 mg | ORAL_TABLET | Freq: Every day | ORAL | 3 refills | Status: DC

## 2018-01-31 MED ORDER — NITROGLYCERIN 0.4 MG SL SUBL: 0.4000 mg | SUBLINGUAL_TABLET | SUBLINGUAL | 3 refills | Status: DC | PRN

## 2018-01-31 MED ORDER — VITAMIN D (ERGOCALCIFEROL) 1.25 MG (50000 UNIT) PO CAPS: 50000.0000 [IU] | ORAL_CAPSULE | ORAL | 0 refills | Status: DC

## 2018-01-31 MED FILL — ROSUVASTATIN CALCIUM 10 MG: 10 | 90 days supply | Qty: 90 | Fill #0

## 2018-01-31 MED FILL — VIT D2 1.25 MG (50,000 UNIT: 1.25 MG | 84 days supply | Qty: 12 | Fill #0

## 2018-01-31 MED FILL — NITROGLYCERIN 0.4 MG TAB SL: 0.4 | 5 days supply | Qty: 25 | Fill #0

## 2018-01-31 NOTE — Progress Notes (Signed)
Cardiology Office Note    Date:  01/31/2018   ID:  Deanna Dudley, DOB May 01, 1956, MRN [id number]  PCP:  Bing Neighbors, FNP  Cardiologist:  New to Dr. Anne Fu  Chief Complaint: Abnormal EKG  History of Present Illness:   Deanna Dudley is a 62 y.o. female with history of hypertension referred by Joaquin Courts, FNP for evaluation of abnormal EKG.  Recently dealing with bilateral muscle spasm on her back and leg.  Seen by PCP and prescribed muscle relaxant with improvement.  EKG done for screening for cardiovascular disease showed left axis deviation and referred for evaluation.  Personally reviewed recent lab work from PCP. 01/02/2018: Cholesterol, Total 182; HDL 33; LDL Calculated 115; Triglycerides 168  Normal electrolytes and kidney function.   The patient is a travel Engineer, civil (consulting).  Currently works at [employer] on [employer] [address].  The patient has a prior history of MI in 2011.  She underwent angioplasty only.  This was done at [facility] in [place].  History of syncope in 2007 with loss of consciousness.  She had normal echocardiogram and stress test afterwards.  No prior records available for review.  For the past few months she has been having intermittent substernal chest pressure with shortness of breath.  Symptoms spontaneously resolve in 5-10 minutes.  No radiation.  During her MI she only had a jaw pain.  No regular exercise.  She has chronic orthopnea and sleeps on multiple pillows.  Also has lower extremity edema.  Improved after increasing hydrochlorothiazide to 25 mg daily for past 2 days.  She snores a lot at night.  She had an myalgia on Lipitor previously.  Ongoing tobacco smoking.  She smokes 1 pack a day for past 28 years.  Daily alcohol use.  Father has history of CAD requiring CABG in early 79s.    Past Medical History:  Diagnosis Date  . CAD (coronary artery disease), native coronary artery    s/p Angioplast only in 2011 @ [place]  . Hyperlipidemia  LDL goal <70   . Hypertension   . Muscle spasm   . Myocardial infarction (HCC) 2011  . Syncope   . Tobacco abuse     Past Surgical History:  Procedure Laterality Date  . ABDOMINAL HYSTERECTOMY  1983  . APPENDECTOMY     Age 26  . BREAST SURGERY Bilateral    Removed ducts from both breast  . CHOLECYSTECTOMY  1995  . SHOULDER SURGERY Right    Rotater cuff repair  . TONSILLECTOMY     Age 51    Current Medications: Prior to Admission medications   Medication Sig Start Date End Date Taking? Authorizing Provider  aspirin 81 MG chewable tablet Chew by mouth daily.    [provider]  cyclobenzaprine (FLEXERIL) 10 MG tablet Take 1 tablet (10 mg total) by mouth 3 (three) times daily as needed for muscle spasms. 01/02/18   Bing Neighbors, FNP  gemfibrozil (LOPID) 600 MG tablet Take 1 tablet (600 mg total) by mouth 2 (two) times daily before a meal. 01/14/18   Bing Neighbors, FNP  hydrochlorothiazide (HYDRODIURIL) 12.5 MG tablet Take 1 tablet (12.5 mg total) by mouth daily. 01/29/18   Bing Neighbors, FNP  hydrOXYzine (ATARAX/VISTARIL) 50 MG tablet Take 1 tablet (50 mg total) by mouth 3 (three) times daily. 01/29/18   Bing Neighbors, FNP  lisinopril (PRINIVIL,ZESTRIL) 20 MG tablet Take 1 tablet (20 mg total) by mouth daily. 01/02/18   Joaquin Courts  S, FNP  metoprolol succinate (TOPROL-XL) 50 MG 24 hr tablet Take 1 tablet (50 mg total) by mouth daily. 12/25/17   Bing Neighbors, FNP  predniSONE (DELTASONE) 20 MG tablet Take 3 PO QAM x3days, 2 PO QAM x3days, 1 PO QAM x3days 01/29/18   Bing Neighbors, FNP    Allergies:   Morphine and related; Statins; Demerol [meperidine]; and Xanax [alprazolam]   Social History   Socioeconomic History  . Marital status: Married    Spouse name: None  . Number of children: None  . Years of education: None  . Highest education level: None  Social Needs  . Financial resource strain: None  . Food insecurity - worry: None  . Food  insecurity - inability: None  . Transportation needs - medical: None  . Transportation needs - non-medical: None  Occupational History  . None  Tobacco Use  . Smoking status: Never Smoker  . Smokeless tobacco: Never Used  Substance and Sexual Activity  . Alcohol use: Yes  . Drug use: No  . Sexual activity: None  Other Topics Concern  . None  Social History Narrative  . None     Family History:  The patient's family history includes CAD in her father; Clotting disorder in her maternal grandfather; Colon cancer in her mother; Diabetes in her father; Heart disease in her maternal grandfather and paternal grandmother; Hypothyroidism in her father; Lung cancer in her mother; Stroke in her mother.   ROS:   Please see the history of present illness.    ROS All other systems reviewed and are negative.   PHYSICAL EXAM:   VS:  BP 112/86 (BP Location: Right Arm, Patient Position: Sitting, Cuff Size: Large)   Pulse 84   Ht 5\' 5"  (1.651 m)   Wt 257 lb (116.6 kg)   SpO2 95%   BMI 42.77 kg/m    GEN: Well nourished, well developed, in no acute distress  HEENT: normal  Neck: no JVD, carotid bruits, or masses Cardiac: RRR; no murmurs, rubs, or gallops, 1 + edema  Respiratory:  clear to auscultation bilaterally, normal work of breathing GI: soft, nontender, nondistended, + BS MS: no deformity or atrophy  Skin: warm and dry, no rash Neuro:  Alert and Oriented x 3, Strength and sensation are intact Psych: euthymic mood, full affect  Wt Readings from Last 3 Encounters:  01/31/18 257 lb (116.6 kg)  01/29/18 257 lb (116.6 kg)  01/02/18 264 lb (119.7 kg)      Studies/Labs Reviewed:   EKG:  EKG is ordered today.  The ekg ordered today demonstrates NSR with Q wave inferiorlly  Recent Labs: 12/18/2017: Hemoglobin 14.6; Platelets 253 01/29/2018: ALT 29; BUN 18; Creatinine, Ser 0.87; Potassium 4.9; Sodium 138   Lipid Panel    Component Value Date/Time   CHOL 182 01/02/2018 0844   TRIG  168 (H) 01/02/2018 0844   HDL 33 (L) 01/02/2018 0844   CHOLHDL 5.5 (H) 01/02/2018 0844   LDLCALC 115 (H) 01/02/2018 0844    Additional studies/ records that were reviewed today include:   As summarized above:     ASSESSMENT & PLAN:    1. Unstable angina -Symptoms ongoing for past few months.  Recently worsened.  No regular exercise.  Given past medical history of MI, angioplasty, obesity, hypertension, hyperlipidemia and ongoing tobacco smoking>>> will proceed with cardiac catheterization.  Will add right-sided heart cath to evaluate pressure. -Continue aspirin and beta-blocker.  Add statin.  2. Presumed acute on chronic  diastolic heart failure -She has chronic orthopnea and lower extremity edema.  Her orthopnea could be due to sleep apnea.  Increase hydrochlorothiazide to 25 mg daily.  May consider echocardiogram after cath.  3.  Hypertension -Stable on current medication.  Increase hydrochlorothiazide to 25 mg as above.  Continue Toprol-XL 50 mg daily and lisinopril 20 mg daily.  Advised to keep a log of blood pressure.  4.  Hyperlipidemia - 01/02/2018: Cholesterol, Total 182; HDL 33; LDL Calculated 115; Triglycerides 168  -LDL goal less than 70.  She has myalgia on Lipitor previously. -She is agreed to start low-dose Crestor.  5.  Snoring -Patient declines sleep study.  6 Morbid obesity.  -She is working on weight loss.  She has lost 9 pounds in the past 10 days.  Encourage daily exercise.  7.  Tobacco smoking -Encourage cessation.  Education given.    Medication Adjustments/Labs and Tests Ordered: Current medicines are reviewed at length with the patient today.  Concerns regarding medicines are outlined above.  Medication changes, Labs and Tests ordered today are listed in the Patient Instructions below. Patient Instructions  Medication Instructions: Your physician has recommended you make the following change in your medication:  -1) START Rosuvastatin (Crestor) 10  mg - Take 1 tablet (10 mg) by mouth daily -2) INCREASE Hydrochlorothiazide (HCTZ) 25 mg - Take 1 tablet (25 mg) by mouth daily -3) NITROGLYCERIN - Place 1 tablet under your tongue every 5 min with a maximum dose of 3 as needed for chest pain  Labwork: Your physician has recommended that you have lab work today: BMET, CBC, PT/INR  Procedures/Testing: Your physician has requested that you have a cardiac catheterization. Cardiac catheterization is used to diagnose and/or treat various heart conditions. Doctors may recommend this procedure for a number of different reasons. The most common reason is to evaluate chest pain. Chest pain can be a symptom of coronary artery disease (CAD), and cardiac catheterization can show whether plaque is narrowing or blocking your heart's arteries. This procedure is also used to evaluate the valves, as well as measure the blood flow and oxygen levels in different parts of your heart. For further information please visit [email or web address] Please follow instruction sheet, as given.   Follow-Up: Your physician recommends that you schedule a follow-up appointment in: 4 WEEKS with Antionette PolesVin Bhaghat, PA-C  Your physician recommends that you schedule a follow-up appointment in: 2-3 MONTHS with Dr. Anne FuSkains.    [facility] [address], [address] [place] [address] Dept: [phone] Loc: [phone]  Harle StanfordCherry Delarocha  01/31/2018  You are scheduled for a Cardiac Catheterization on Wednesday, March 20 with Dr. Lance MussJayadeep Varanasi.  1. Please [facility] at [facility]: [address] [place], [place] [address] at 9:30 AM (two hours before your procedure to ensure your preparation). Free valet parking service is available.   Special note: Every effort is made to have your procedure done on time. Please understand that emergencies sometimes delay  scheduled procedures.  2. Diet: Do not eat or drink anything after midnight prior to your procedure except sips of water to take medications.  3. Labs: You will have labs done in office today  4. You may take all your regular morning medications with enough water to get them down safely. On the morning of your procedure, take your Aspirin  5. Plan for one night stay--bring personal belongings. 6. Bring  a current list of your medications and current insurance cards. 7. You MUST have a responsible person to drive you home. 8. Someone MUST be with you the first 24 hours after you arrive home or your discharge will be delayed. 9. Please wear clothes that are easy to get on and off and wear slip-on shoes.  Thank you for allowing [place] to care for you!   -- [facility]   If you need a refill on your cardiac medications before your next appointment, please call your pharmacy.      Lorelei Pont, [place]  01/31/2018 12:17 PM    [facility] [address] [place], [place], [place]  [address] Phone: [phone]; Fax: [phone]

## 2018-01-31 NOTE — Patient Instructions (Signed)
Medication Instructions: Your physician has recommended you make the following change in your medication:  -1) START Rosuvastatin (Crestor) 10 mg - Take 1 tablet (10 mg) by mouth daily -2) INCREASE Hydrochlorothiazide (HCTZ) 25 mg - Take 1 tablet (25 mg) by mouth daily -3) NITROGLYCERIN - Place 1 tablet under your tongue every 5 min with a maximum dose of 3 as needed for chest pain  Labwork: Your physician has recommended that you have lab work today: BMET, CBC, PT/INR  Procedures/Testing: Your physician has requested that you have a cardiac catheterization. Cardiac catheterization is used to diagnose and/or treat various heart conditions. Doctors may recommend this procedure for a number of different reasons. The most common reason is to evaluate chest pain. Chest pain can be a symptom of coronary artery disease (CAD), and cardiac catheterization can show whether plaque is narrowing or blocking your heart's arteries. This procedure is also used to evaluate the valves, as well as measure the blood flow and oxygen levels in different parts of your heart. For further information please visit [email or web address] Please follow instruction sheet, as given.   Follow-Up: Your physician recommends that you schedule a follow-up appointment in: 4 WEEKS with Antionette PolesVin Bhaghat, PA-C  Your physician recommends that you schedule a follow-up appointment in: 2-3 MONTHS with Dr. Anne FuSkains.    [facility] [address], [address] [place] [address] Dept: [phone] Loc: [phone]  Deanna Dudley  01/31/2018  You are scheduled for a Cardiac Catheterization on Wednesday, March 20 with Dr. Lance MussJayadeep Varanasi.  1. Please [facility] the [facility] at [facility]: [address] [place], [place] [address] at 9:30 AM (two hours before your procedure to ensure your preparation). Free valet parking service  is available.   Special note: Every effort is made to have your procedure done on time. Please understand that emergencies sometimes delay scheduled procedures.  2. Diet: Do not eat or drink anything after midnight prior to your procedure except sips of water to take medications.  3. Labs: You will have labs done in office today  4. You may take all your regular morning medications with enough water to get them down safely. On the morning of your procedure, take your Aspirin  5. Plan for one night stay--bring personal belongings. 6. Bring a current list of your medications and current insurance cards. 7. You MUST have a responsible person to drive you home. 8. Someone MUST be with you the first 24 hours after you arrive home or your discharge will be delayed. 9. Please wear clothes that are easy to get on and off and wear slip-on shoes.  Thank you for allowing us to care for you!   --  Invasive Cardiovascular services   If you need a refill on your cardiac medications before your next appointment, please call your pharmacy.

## 2018-01-31 NOTE — H&P (View-Only) (Signed)
Cardiology Office Note    Date:  01/31/2018   ID:  Deanna Dudley, DOB May 01, 1956, MRN [id number]  PCP:  Bing Neighbors, FNP  Cardiologist:  New to Dr. Anne Fu  Chief Complaint: Abnormal EKG  History of Present Illness:   Deanna Dudley is a 62 y.o. female with history of hypertension referred by Joaquin Courts, FNP for evaluation of abnormal EKG.  Recently dealing with bilateral muscle spasm on her back and leg.  Seen by PCP and prescribed muscle relaxant with improvement.  EKG done for screening for cardiovascular disease showed left axis deviation and referred for evaluation.  Personally reviewed recent lab work from PCP. 01/02/2018: Cholesterol, Total 182; HDL 33; LDL Calculated 115; Triglycerides 168  Normal electrolytes and kidney function.   The patient is a travel Engineer, civil (consulting).  Currently works at [employer] on [employer] [address].  The patient has a prior history of MI in 2011.  She underwent angioplasty only.  This was done at [facility] in [place].  History of syncope in 2007 with loss of consciousness.  She had normal echocardiogram and stress test afterwards.  No prior records available for review.  For the past few months she has been having intermittent substernal chest pressure with shortness of breath.  Symptoms spontaneously resolve in 5-10 minutes.  No radiation.  During her MI she only had a jaw pain.  No regular exercise.  She has chronic orthopnea and sleeps on multiple pillows.  Also has lower extremity edema.  Improved after increasing hydrochlorothiazide to 25 mg daily for past 2 days.  She snores a lot at night.  She had an myalgia on Lipitor previously.  Ongoing tobacco smoking.  She smokes 1 pack a day for past 28 years.  Daily alcohol use.  Father has history of CAD requiring CABG in early 79s.    Past Medical History:  Diagnosis Date  . CAD (coronary artery disease), native coronary artery    s/p Angioplast only in 2011 @ [place]  . Hyperlipidemia  LDL goal <70   . Hypertension   . Muscle spasm   . Myocardial infarction (HCC) 2011  . Syncope   . Tobacco abuse     Past Surgical History:  Procedure Laterality Date  . ABDOMINAL HYSTERECTOMY  1983  . APPENDECTOMY     Age 26  . BREAST SURGERY Bilateral    Removed ducts from both breast  . CHOLECYSTECTOMY  1995  . SHOULDER SURGERY Right    Rotater cuff repair  . TONSILLECTOMY     Age 51    Current Medications: Prior to Admission medications   Medication Sig Start Date End Date Taking? Authorizing Provider  aspirin 81 MG chewable tablet Chew by mouth daily.    [provider]  cyclobenzaprine (FLEXERIL) 10 MG tablet Take 1 tablet (10 mg total) by mouth 3 (three) times daily as needed for muscle spasms. 01/02/18   Bing Neighbors, FNP  gemfibrozil (LOPID) 600 MG tablet Take 1 tablet (600 mg total) by mouth 2 (two) times daily before a meal. 01/14/18   Bing Neighbors, FNP  hydrochlorothiazide (HYDRODIURIL) 12.5 MG tablet Take 1 tablet (12.5 mg total) by mouth daily. 01/29/18   Bing Neighbors, FNP  hydrOXYzine (ATARAX/VISTARIL) 50 MG tablet Take 1 tablet (50 mg total) by mouth 3 (three) times daily. 01/29/18   Bing Neighbors, FNP  lisinopril (PRINIVIL,ZESTRIL) 20 MG tablet Take 1 tablet (20 mg total) by mouth daily. 01/02/18   Joaquin Courts  S, FNP  metoprolol succinate (TOPROL-XL) 50 MG 24 hr tablet Take 1 tablet (50 mg total) by mouth daily. 12/25/17   Bing Neighbors, FNP  predniSONE (DELTASONE) 20 MG tablet Take 3 PO QAM x3days, 2 PO QAM x3days, 1 PO QAM x3days 01/29/18   Bing Neighbors, FNP    Allergies:   Morphine and related; Statins; Demerol [meperidine]; and Xanax [alprazolam]   Social History   Socioeconomic History  . Marital status: Married    Spouse name: None  . Number of children: None  . Years of education: None  . Highest education level: None  Social Needs  . Financial resource strain: None  . Food insecurity - worry: None  . Food  insecurity - inability: None  . Transportation needs - medical: None  . Transportation needs - non-medical: None  Occupational History  . None  Tobacco Use  . Smoking status: Never Smoker  . Smokeless tobacco: Never Used  Substance and Sexual Activity  . Alcohol use: Yes  . Drug use: No  . Sexual activity: None  Other Topics Concern  . None  Social History Narrative  . None     Family History:  The patient's family history includes CAD in her father; Clotting disorder in her maternal grandfather; Colon cancer in her mother; Diabetes in her father; Heart disease in her maternal grandfather and paternal grandmother; Hypothyroidism in her father; Lung cancer in her mother; Stroke in her mother.   ROS:   Please see the history of present illness.    ROS All other systems reviewed and are negative.   PHYSICAL EXAM:   VS:  BP 112/86 (BP Location: Right Arm, Patient Position: Sitting, Cuff Size: Large)   Pulse 84   Ht 5\' 5"  (1.651 m)   Wt 257 lb (116.6 kg)   SpO2 95%   BMI 42.77 kg/m    GEN: Well nourished, well developed, in no acute distress  HEENT: normal  Neck: no JVD, carotid bruits, or masses Cardiac: RRR; no murmurs, rubs, or gallops, 1 + edema  Respiratory:  clear to auscultation bilaterally, normal work of breathing GI: soft, nontender, nondistended, + BS MS: no deformity or atrophy  Skin: warm and dry, no rash Neuro:  Alert and Oriented x 3, Strength and sensation are intact Psych: euthymic mood, full affect  Wt Readings from Last 3 Encounters:  01/31/18 257 lb (116.6 kg)  01/29/18 257 lb (116.6 kg)  01/02/18 264 lb (119.7 kg)      Studies/Labs Reviewed:   EKG:  EKG is ordered today.  The ekg ordered today demonstrates NSR with Q wave inferiorlly  Recent Labs: 12/18/2017: Hemoglobin 14.6; Platelets 253 01/29/2018: ALT 29; BUN 18; Creatinine, Ser 0.87; Potassium 4.9; Sodium 138   Lipid Panel    Component Value Date/Time   CHOL 182 01/02/2018 0844   TRIG  168 (H) 01/02/2018 0844   HDL 33 (L) 01/02/2018 0844   CHOLHDL 5.5 (H) 01/02/2018 0844   LDLCALC 115 (H) 01/02/2018 0844    Additional studies/ records that were reviewed today include:   As summarized above:     ASSESSMENT & PLAN:    1. Unstable angina -Symptoms ongoing for past few months.  Recently worsened.  No regular exercise.  Given past medical history of MI, angioplasty, obesity, hypertension, hyperlipidemia and ongoing tobacco smoking>>> will proceed with cardiac catheterization.  Will add right-sided heart cath to evaluate pressure. -Continue aspirin and beta-blocker.  Add statin.  2. Presumed acute on chronic  diastolic heart failure -She has chronic orthopnea and lower extremity edema.  Her orthopnea could be due to sleep apnea.  Increase hydrochlorothiazide to 25 mg daily.  May consider echocardiogram after cath.  3.  Hypertension -Stable on current medication.  Increase hydrochlorothiazide to 25 mg as above.  Continue Toprol-XL 50 mg daily and lisinopril 20 mg daily.  Advised to keep a log of blood pressure.  4.  Hyperlipidemia - 01/02/2018: Cholesterol, Total 182; HDL 33; LDL Calculated 115; Triglycerides 168  -LDL goal less than 70.  She has myalgia on Lipitor previously. -She is agreed to start low-dose Crestor.  5.  Snoring -Patient declines sleep study.  6 Morbid obesity.  -She is working on weight loss.  She has lost 9 pounds in the past 10 days.  Encourage daily exercise.  7.  Tobacco smoking -Encourage cessation.  Education given.    Medication Adjustments/Labs and Tests Ordered: Current medicines are reviewed at length with the patient today.  Concerns regarding medicines are outlined above.  Medication changes, Labs and Tests ordered today are listed in the Patient Instructions below. Patient Instructions  Medication Instructions: Your physician has recommended you make the following change in your medication:  -1) START Rosuvastatin (Crestor) 10  mg - Take 1 tablet (10 mg) by mouth daily -2) INCREASE Hydrochlorothiazide (HCTZ) 25 mg - Take 1 tablet (25 mg) by mouth daily -3) NITROGLYCERIN - Place 1 tablet under your tongue every 5 min with a maximum dose of 3 as needed for chest pain  Labwork: Your physician has recommended that you have lab work today: BMET, CBC, PT/INR  Procedures/Testing: Your physician has requested that you have a cardiac catheterization. Cardiac catheterization is used to diagnose and/or treat various heart conditions. Doctors may recommend this procedure for a number of different reasons. The most common reason is to evaluate chest pain. Chest pain can be a symptom of coronary artery disease (CAD), and cardiac catheterization can show whether plaque is narrowing or blocking your heart's arteries. This procedure is also used to evaluate the valves, as well as measure the blood flow and oxygen levels in different parts of your heart. For further information please visit [email or web address]. Please follow instruction sheet, as given.   Follow-Up: Your physician recommends that you schedule a follow-up appointment in: 4 WEEKS with Antionette PolesVin Bhaghat, PA-C  Your physician recommends that you schedule a follow-up appointment in: 2-3 MONTHS with Dr. Anne FuSkains.    [facility] OFF[facility] [address], [address] [place] [address] Dept: [phone] Loc: [phone]  Harle StanfordCherry Delarocha  01/31/2018  You are scheduled for a Cardiac Catheterization on Wednesday, March 20 with Dr. Lance MussJayadeep Varanasi.  1. Please [facility] at [facility]: [address] [place], [place] [address] at 9:30 AM (two hours before your procedure to ensure your preparation). Free valet parking service is available.   Special note: Every effort is made to have your procedure done on time. Please understand that emergencies sometimes delay  scheduled procedures.  2. Diet: Do not eat or drink anything after midnight prior to your procedure except sips of water to take medications.  3. Labs: You will have labs done in office today  4. You may take all your regular morning medications with enough water to get them down safely. On the morning of your procedure, take your Aspirin  5. Plan for one night stay--bring personal belongings. 6. Bring  a current list of your medications and current insurance cards. 7. You MUST have a responsible person to drive you home. 8. Someone MUST be with you the first 24 hours after you arrive home or your discharge will be delayed. 9. Please wear clothes that are easy to get on and off and wear slip-on shoes.  Thank you for allowing [place] to care for you!   -- [facility]   If you need a refill on your cardiac medications before your next appointment, please call your pharmacy.      Lorelei Pont, [place]  01/31/2018 12:17 PM    [facility] [address] [place], [place], [place]  [address] Phone: [phone]; Fax: [phone]

## 2018-01-31 NOTE — Progress Notes (Signed)
Mychart message sent regarding recent labs.

## 2018-02-01 LAB — CBC WITH DIFFERENTIAL/PLATELET
BASOS ABS: 0 10*3/uL (ref 0.0–0.2)
Basos: 0 %
EOS (ABSOLUTE): 0 10*3/uL (ref 0.0–0.4)
Eos: 0 %
Hematocrit: 46.6 % (ref 34.0–46.6)
Hemoglobin: 16.2 g/dL — ABNORMAL HIGH (ref 11.1–15.9)
IMMATURE GRANS (ABS): 0 10*3/uL (ref 0.0–0.1)
Immature Granulocytes: 0 %
LYMPHS: 17 %
Lymphocytes Absolute: 2.6 10*3/uL (ref 0.7–3.1)
MCH: 32.2 pg (ref 26.6–33.0)
MCHC: 34.8 g/dL (ref 31.5–35.7)
MCV: 93 fL (ref 79–97)
Monocytes Absolute: 0.7 10*3/uL (ref 0.1–0.9)
Monocytes: 4 %
NEUTROS ABS: 12.1 10*3/uL — AB (ref 1.4–7.0)
NEUTROS PCT: 79 %
PLATELETS: 345 10*3/uL (ref 150–379)
RBC: 5.03 x10E6/uL (ref 3.77–5.28)
RDW: 13.8 % (ref 12.3–15.4)
WBC: 15.5 10*3/uL — AB (ref 3.4–10.8)

## 2018-02-01 LAB — BASIC METABOLIC PANEL
BUN/Creatinine Ratio: 22 (ref 12–28)
BUN: 22 mg/dL (ref 8–27)
CALCIUM: 10.7 mg/dL — AB (ref 8.7–10.3)
CHLORIDE: 95 mmol/L — AB (ref 96–106)
CO2: 23 mmol/L (ref 20–29)
Creatinine, Ser: 1.02 mg/dL — ABNORMAL HIGH (ref 0.57–1.00)
GFR calc non Af Amer: 60 mL/min/{1.73_m2} (ref >59)
GFR, EST AFRICAN AMERICAN: 69 mL/min/{1.73_m2} (ref >59)
GLUCOSE: 101 mg/dL — AB (ref 65–99)
POTASSIUM: 4.7 mmol/L (ref 3.5–5.2)
Sodium: 138 mmol/L (ref 134–144)

## 2018-02-01 LAB — PROTIME-INR
INR: 1 (ref 0.8–1.2)
Prothrombin Time: 10.2 s (ref 9.1–12.0)

## 2018-02-05 ENCOUNTER — Encounter: Payer: Self-pay | Admitting: Neurology

## 2018-02-05 ENCOUNTER — Ambulatory Visit (INDEPENDENT_AMBULATORY_CARE_PROVIDER_SITE_OTHER): Payer: 59 | Admitting: Neurology

## 2018-02-05 ENCOUNTER — Telehealth: Payer: Self-pay | Admitting: *Deleted

## 2018-02-05 VITALS — BP 127/76 | HR 80 | Ht 65.0 in | Wt 254.5 lb

## 2018-02-05 DIAGNOSIS — R202 Paresthesia of skin: Secondary | ICD-10-CM | POA: Diagnosis not present

## 2018-02-05 DIAGNOSIS — M62838 Other muscle spasm: Secondary | ICD-10-CM | POA: Diagnosis not present

## 2018-02-05 MED ORDER — GABAPENTIN 300 MG PO CAPS: 300.0000 mg | ORAL_CAPSULE | Freq: Three times a day (TID) | ORAL | 11 refills | Status: DC

## 2018-02-05 MED FILL — GABAPENTIN 300 MG CAPS: 300 | 30 days supply | Qty: 90 | Fill #0

## 2018-02-05 NOTE — Progress Notes (Signed)
Pt has been made aware of normal result and verbalized understanding.  jw 02/05/18

## 2018-02-05 NOTE — Telephone Encounter (Addendum)
Pt contacted pre-catheterization scheduled at [facility] ZOX:[id number]:Wednesday March 20,2019 11:30 AM Verified arrival [facility] and [facility] Tower at: 9 AM Nothing to eat or drink after midnight prior to cath. Verified allergies in Epic.  Verified no diabetes medications.   Hold:  HCTZ am of cath  Except hold medications AM meds can be  taken pre-cath with sip of water including: ASA 81 mg  Confirmed patient has responsible person to drive home post procedure and observe patient for 24 hours: yes  WBC 15.5 done 01/31/18--pt states she has been taking prednisone for back spasms, denies any recent illness or fever.
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
## 2018-02-06 ENCOUNTER — Telehealth: Payer: Self-pay | Admitting: Neurology

## 2018-02-06 ENCOUNTER — Ambulatory Visit (HOSPITAL_COMMUNITY)
Admission: RE | Disposition: A | Discharge status: Home / Self Care | Payer: Self-pay | Source: Ambulatory Visit | Attending: Interventional Cardiology

## 2018-02-06 ENCOUNTER — Ambulatory Visit (HOSPITAL_COMMUNITY)
Admission: RE | Admit: 2018-02-06 | Discharge: 2018-02-06 | Disposition: A | Discharge status: Home / Self Care | Payer: 59 | Source: Ambulatory Visit | Attending: Interventional Cardiology | Admitting: Interventional Cardiology

## 2018-02-06 DIAGNOSIS — I252 Old myocardial infarction: Secondary | ICD-10-CM | POA: Insufficient documentation

## 2018-02-06 DIAGNOSIS — F17210 Nicotine dependence, cigarettes, uncomplicated: Secondary | ICD-10-CM | POA: Insufficient documentation

## 2018-02-06 DIAGNOSIS — E785 Hyperlipidemia, unspecified: Secondary | ICD-10-CM | POA: Diagnosis not present

## 2018-02-06 DIAGNOSIS — Z7982 Long term (current) use of aspirin: Secondary | ICD-10-CM | POA: Diagnosis not present

## 2018-02-06 DIAGNOSIS — I25110 Atherosclerotic heart disease of native coronary artery with unstable angina pectoris: Secondary | ICD-10-CM | POA: Insufficient documentation

## 2018-02-06 DIAGNOSIS — Z8249 Family history of ischemic heart disease and other diseases of the circulatory system: Secondary | ICD-10-CM | POA: Insufficient documentation

## 2018-02-06 DIAGNOSIS — Z885 Allergy status to narcotic agent status: Secondary | ICD-10-CM | POA: Insufficient documentation

## 2018-02-06 DIAGNOSIS — I10 Essential (primary) hypertension: Secondary | ICD-10-CM | POA: Insufficient documentation

## 2018-02-06 DIAGNOSIS — Z6841 Body Mass Index (BMI) 40.0 and over, adult: Secondary | ICD-10-CM | POA: Diagnosis not present

## 2018-02-06 DIAGNOSIS — I209 Angina pectoris, unspecified: Secondary | ICD-10-CM | POA: Diagnosis not present

## 2018-02-06 HISTORY — PX: RIGHT/LEFT HEART CATH AND CORONARY ANGIOGRAPHY: CATH118266

## 2018-02-06 LAB — POCT I-STAT 3, ART BLOOD GAS (G3+)
Bicarbonate: 25.1 mmol/L (ref 20.0–28.0)
O2 SAT: 95 %
TCO2: 26 mmol/L (ref 22–32)
pCO2 arterial: 41.1 mmHg (ref 32.0–48.0)
pH, Arterial: 7.394 (ref 7.350–7.450)
pO2, Arterial: 74 mmHg — ABNORMAL LOW (ref 83.0–108.0)

## 2018-02-06 LAB — POCT I-STAT 3, VENOUS BLOOD GAS (G3P V)
BICARBONATE: 26.2 mmol/L (ref 20.0–28.0)
O2 SAT: 73 %
PO2 VEN: 41 mmHg (ref 32.0–45.0)
TCO2: 28 mmol/L (ref 22–32)
pCO2, Ven: 47.3 mmHg (ref 44.0–60.0)
pH, Ven: 7.351 (ref 7.250–7.430)

## 2018-02-06 SURGERY — RIGHT/LEFT HEART CATH AND CORONARY ANGIOGRAPHY
Anesthesia: LOCAL

## 2018-02-06 MED ORDER — IOPAMIDOL (ISOVUE-370) INJECTION 76%
INTRAVENOUS | Status: AC
  Filled 2018-02-06: qty 100

## 2018-02-06 MED ORDER — HEPARIN (PORCINE) IN NACL 2-0.9 UNIT/ML-% IJ SOLN
INTRAMUSCULAR | Status: AC
  Filled 2018-02-06: qty 500

## 2018-02-06 MED ORDER — IOPAMIDOL (ISOVUE-370) INJECTION 76%
INTRAVENOUS | Status: DC | PRN
  Administered 2018-02-06: 55 mL via INTRA_ARTERIAL

## 2018-02-06 MED ORDER — SODIUM CHLORIDE 0.9 % IV SOLN: 250.0000 mL | INTRAVENOUS | Status: DC | PRN

## 2018-02-06 MED ORDER — LIDOCAINE HCL (PF) 1 % IJ SOLN
INTRAMUSCULAR | Status: DC | PRN
  Administered 2018-02-06: 15 mL via INTRADERMAL
  Administered 2018-02-06: 4 mL via INTRADERMAL
  Administered 2018-02-06: 2 mL via INTRADERMAL

## 2018-02-06 MED ORDER — SODIUM CHLORIDE 0.9 % IV SOLN: INTRAVENOUS | Status: AC

## 2018-02-06 MED ORDER — LIDOCAINE HCL (PF) 1 % IJ SOLN
INTRAMUSCULAR | Status: AC
  Filled 2018-02-06: qty 30

## 2018-02-06 MED ORDER — SODIUM CHLORIDE 0.9% FLUSH: 3.0000 mL | INTRAVENOUS | Status: DC | PRN

## 2018-02-06 MED ORDER — FENTANYL CITRATE (PF) 100 MCG/2ML IJ SOLN
INTRAMUSCULAR | Status: DC | PRN
  Administered 2018-02-06 (×2): 25 ug via INTRAVENOUS

## 2018-02-06 MED ORDER — MIDAZOLAM HCL 2 MG/2ML IJ SOLN
INTRAMUSCULAR | Status: AC
  Filled 2018-02-06: qty 2

## 2018-02-06 MED ORDER — VERAPAMIL HCL 2.5 MG/ML IV SOLN
INTRAVENOUS | Status: AC
  Filled 2018-02-06: qty 2

## 2018-02-06 MED ORDER — FENTANYL CITRATE (PF) 100 MCG/2ML IJ SOLN
INTRAMUSCULAR | Status: AC
  Filled 2018-02-06: qty 2

## 2018-02-06 MED ORDER — ASPIRIN 81 MG PO CHEW: 81.0000 mg | CHEWABLE_TABLET | ORAL | Status: DC

## 2018-02-06 MED ORDER — SODIUM CHLORIDE 0.9% FLUSH: 3.0000 mL | Freq: Two times a day (BID) | INTRAVENOUS | Status: DC

## 2018-02-06 MED ORDER — MIDAZOLAM HCL 2 MG/2ML IJ SOLN
INTRAMUSCULAR | Status: DC | PRN
  Administered 2018-02-06: 1 mg via INTRAVENOUS
  Administered 2018-02-06: 2 mg via INTRAVENOUS

## 2018-02-06 MED ORDER — HEPARIN SODIUM (PORCINE) 1000 UNIT/ML IJ SOLN
INTRAMUSCULAR | Status: AC
  Filled 2018-02-06: qty 1

## 2018-02-06 MED ORDER — SODIUM CHLORIDE 0.9 % WEIGHT BASED INFUSION: 1.0000 mL/kg/h | INTRAVENOUS | Status: DC

## 2018-02-06 MED ORDER — SODIUM CHLORIDE 0.9 % WEIGHT BASED INFUSION
3.0000 mL/kg/h | INTRAVENOUS | Status: AC
  Administered 2018-02-06: 3 mL/kg/h via INTRAVENOUS

## 2018-02-06 MED ORDER — HEPARIN (PORCINE) IN NACL 2-0.9 UNIT/ML-% IJ SOLN
INTRAMUSCULAR | Status: AC | PRN
  Administered 2018-02-06 (×2): 500 mL via INTRA_ARTERIAL

## 2018-02-06 SURGICAL SUPPLY — 12 items
CATH BALLN WEDGE 5F 110CM (CATHETERS) ×2 IMPLANT
CATH INFINITI 5FR MULTPACK ANG (CATHETERS) ×2 IMPLANT
COVER PRB 48X5XTLSCP FOLD TPE (BAG) ×1 IMPLANT
COVER PROBE 5X48 (BAG) ×1
KIT HEART LEFT (KITS) ×2 IMPLANT
PACK CARDIAC CATHETERIZATION (CUSTOM PROCEDURE TRAY) ×2 IMPLANT
SHEATH AVANTI 11CM 5FR (SHEATH) ×2 IMPLANT
SHEATH GLIDE SLENDER 4/5FR (SHEATH) ×2 IMPLANT
SHEATH RAIN RADIAL 21G 6FR (SHEATH) ×2 IMPLANT
TRANSDUCER W/STOPCOCK (MISCELLANEOUS) ×2 IMPLANT
TUBING CIL FLEX 10 FLL-RA (TUBING) ×2 IMPLANT
WIRE EMERALD 3MM-J .035X150CM (WIRE) ×2 IMPLANT

## 2018-02-06 NOTE — Discharge Instructions (Signed)

## 2018-02-06 NOTE — Interval H&P Note (Signed)
Cath Lab Visit (complete for each Cath Lab visit)  Clinical Evaluation Leading to the Procedure:   ACS: No  Non-ACS:    Anginal Classification: CCS III  Anti-ischemic medical therapy: Minimal Therapy (1 class of medications)  Non-Invasive Test Results: No non-invasive testing performed  Prior CABG: No previous CABG      History and Physical Interval Note:  02/06/2018 11:40 AM  Deanna Dudley  has presented today for surgery, with the diagnosis of unstable angina  The various methods of treatment have been discussed with the patient and family. After consideration of risks, benefits and other options for treatment, the patient has consented to  Procedure(s): RIGHT/LEFT HEART CATH AND CORONARY ANGIOGRAPHY (N/A) as a surgical intervention .  The patient's history has been reviewed, patient examined, no change in status, stable for surgery.  I have reviewed the patient's chart and labs.  Questions were answered to the patient's satisfaction.     Lance MussJayadeep Varanasi

## 2018-02-06 NOTE — Research (Signed)
OPTIMIZE Informed Consent   Subject Name: Deanna Dudley  Subject met inclusion and exclusion criteria.  The informed consent form, study requirements and expectations were reviewed with the subject and questions and concerns were addressed prior to the signing of the consent form.  The subject verbalized understanding of the trail requirements.  The subject agreed to participate in the OPTIMIZE trial and signed the informed consent.  The informed consent was obtained prior to performance of any protocol-specific procedures for the subject.  A copy of the signed informed consent was given to the subject and a copy was placed in the subject's medical record. Only applicable if randomized.   Philemon Kingdom D 02/06/2018, 1030 AM

## 2018-02-06 NOTE — Progress Notes (Signed)
Mrs. Veldhuizen's 5 fr. Venous sheath and 5 fr arterial sheaths were removed. Manual pressure was held for 20 minutes arterial and 10 minutes venous. Neither sire shows signs of bleeding, hematoma, or any other complications. Mrs. Hoiland was given instructions on care of sites and when to call for help. Bed Rest begins for her at 1320.

## 2018-02-06 NOTE — Telephone Encounter (Signed)
[facility] um[facility] auth: NPR patient requested open MRI. Faxed order to [facility] for the open MRI. They will reach out to the patient to schedule.

## 2018-02-07 ENCOUNTER — Encounter (HOSPITAL_COMMUNITY): Payer: Self-pay | Admitting: Interventional Cardiology

## 2018-02-07 MED FILL — Heparin Sodium (Porcine) 2 Unit/ML in Sodium Chloride 0.9%: INTRAMUSCULAR | Qty: 1000 | Status: AC

## 2018-02-11 ENCOUNTER — Telehealth: Payer: Self-pay | Admitting: Physician Assistant

## 2018-02-11 NOTE — Telephone Encounter (Signed)
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 This was [facility] for [facility]

## 2018-02-13 ENCOUNTER — Telehealth: Payer: Self-pay | Admitting: Cardiology

## 2018-02-13 ENCOUNTER — Encounter: Payer: Self-pay | Admitting: *Deleted

## 2018-02-13 NOTE — Telephone Encounter (Signed)
New message  Pt verbalized that she is calling for RN  For a return to work note

## 2018-02-13 NOTE — Telephone Encounter (Signed)
Spoke with patient who needs a RTW letter for 02/15/18.  Letter completed and sent through MyChart at her request.

## 2018-02-19 DIAGNOSIS — M47816 Spondylosis without myelopathy or radiculopathy, lumbar region: Secondary | ICD-10-CM | POA: Diagnosis not present

## 2018-02-19 DIAGNOSIS — R51 Headache: Secondary | ICD-10-CM | POA: Diagnosis not present

## 2018-02-19 DIAGNOSIS — M4316 Spondylolisthesis, lumbar region: Secondary | ICD-10-CM | POA: Diagnosis not present

## 2018-02-25 ENCOUNTER — Telehealth: Payer: Self-pay | Admitting: Neurology

## 2018-02-25 NOTE — Telephone Encounter (Signed)
Please call patient, MRI of the lumbar spine showed severe facet arthritis at L4-5, but there was no significant canal or foraminal narrowing,   MRI of the brain showed no significant abnormality

## 2018-02-25 NOTE — Telephone Encounter (Addendum)
Left patient a detailed message, with results, on voicemail (ok per DPR). Dr. Terrace ArabiaYan will review in detail at her appt on 03/06/18.  Provided our number to call back with any questions prior to her follow up.

## 2018-02-26 ENCOUNTER — Ambulatory Visit: Payer: 59 | Admitting: Physician Assistant

## 2018-03-06 ENCOUNTER — Ambulatory Visit (INDEPENDENT_AMBULATORY_CARE_PROVIDER_SITE_OTHER): Payer: 59 | Admitting: Neurology

## 2018-03-06 ENCOUNTER — Encounter: Payer: Self-pay | Admitting: Neurology

## 2018-03-06 VITALS — BP 129/82 | HR 70 | Ht 65.0 in | Wt 254.5 lb

## 2018-03-06 DIAGNOSIS — M62838 Other muscle spasm: Secondary | ICD-10-CM

## 2018-03-06 MED ORDER — GABAPENTIN 300 MG PO CAPS: 600.0000 mg | ORAL_CAPSULE | Freq: Three times a day (TID) | ORAL | 4 refills | Status: DC

## 2018-03-06 MED FILL — GABAPENTIN 300 MG CAPSULE: 300 | 90 days supply | Qty: 540 | Fill #0
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
## 2018-03-14 MED FILL — METOPROLOL SUCC ER 50 MG TA: 50 | 90 days supply | Qty: 90 | Fill #1

## 2018-03-14 MED FILL — GEMFIBROZIL 600 MG TABLET: 600 | 90 days supply | Qty: 180 | Fill #0

## 2018-03-14 MED FILL — LISINOPRIL 20 MG TABLET: 20 | 90 days supply | Qty: 90 | Fill #0

## 2018-03-14 MED FILL — HYDROCHLOROTHIAZIDE 25 MG T: 25 | 90 days supply | Qty: 90 | Fill #0

## 2018-04-24 ENCOUNTER — Ambulatory Visit: Payer: 59 | Admitting: Cardiology

## 2018-04-30 MED FILL — VIT D2 1.25 MG (50,000 UNIT: 1.25 MG | 84 days supply | Qty: 12 | Fill #1

## 2018-05-01 ENCOUNTER — Encounter: Payer: Self-pay | Admitting: Family Medicine

## 2018-05-01 ENCOUNTER — Ambulatory Visit (INDEPENDENT_AMBULATORY_CARE_PROVIDER_SITE_OTHER): Payer: 59 | Admitting: Family Medicine

## 2018-05-01 VITALS — BP 110/80 | HR 60 | Ht 65.0 in | Wt 254.3 lb

## 2018-05-01 DIAGNOSIS — Z0000 Encounter for general adult medical examination without abnormal findings: Secondary | ICD-10-CM | POA: Diagnosis not present

## 2018-05-01 DIAGNOSIS — Z131 Encounter for screening for diabetes mellitus: Secondary | ICD-10-CM

## 2018-05-01 DIAGNOSIS — Z09 Encounter for follow-up examination after completed treatment for conditions other than malignant neoplasm: Secondary | ICD-10-CM | POA: Diagnosis not present

## 2018-05-01 DIAGNOSIS — I10 Essential (primary) hypertension: Secondary | ICD-10-CM

## 2018-05-01 DIAGNOSIS — E785 Hyperlipidemia, unspecified: Secondary | ICD-10-CM

## 2018-05-01 LAB — POCT URINALYSIS DIP (MANUAL ENTRY)
Bilirubin, UA: NEGATIVE
Blood, UA: NEGATIVE
Glucose, UA: NEGATIVE mg/dL
Ketones, POC UA: NEGATIVE mg/dL
Nitrite, UA: NEGATIVE
Protein Ur, POC: NEGATIVE mg/dL
Spec Grav, UA: <=1.005 — AB (ref 1.010–1.025)
Urobilinogen, UA: 0.2 E.U./dL
pH, UA: 5.5 (ref 5.0–8.0)

## 2018-05-01 NOTE — Progress Notes (Signed)
Subjective:    Patient ID: Deanna Dudley, female    DOB: Mar 26, 1956, 62 y.o.   MRN: [id number]   PCP: Raliegh Ip, NP   Chief Complaint  Patient presents with  . Follow-up    3 month on chronic condition    HPI  Deanna Dudley has a history of Tobacco Abuse, [place], MI, Hypertension, Hyperlipidemia, and CAD. She is here for follow up today.  Current Status: She is doing well with no complaints today. She reports mild fatigue. She denies fevers, chills, recent infections, weight loss, and night sweats.  She has occasional headaches and she had developed visual changes lately. She is scheduled to see Optometry. Denies dizziness, and falls. She continues to have an occasional cough, r/t ACEI use, but states that it has not worsened. No chest pain, heart palpitations, and shortness of breath reported.   No reports of GI problems. She has no reports of blood in stools, dysuria and hematuria.   No depression or anxiety.   She reports mild back pain today. She has muscle spasms but not as frequently sine beginning Gabapentin.   She is accompanied today by her husband.   Past Medical History:  Diagnosis Date  . CAD (coronary artery disease), native coronary artery    s/p Angioplast only in 2011 @ [place]  . Hyperlipidemia LDL goal <70   . Hypertension   . Muscle spasm   . Myocardial infarction ([facility]) 2011  . Syncope   . Tobacco abuse     Family History  Problem Relation Age of Onset  . Stroke Mother   . Colon cancer Mother   . Lung cancer Mother   . Hypothyroidism Father   . Diabetes Father   . CAD Father   . Heart disease Maternal Grandfather   . Clotting disorder Maternal Grandfather   . Heart disease Paternal Grandmother     Social History   Socioeconomic History  . Marital status: Married    Spouse name: Not on file  . Number of children: Not on file  . Years of education: Not on file  . Highest education level: Not on file  Occupational History  . Not on  file  Social Needs  . Financial resource strain: Not on file  . Food insecurity:    Worry: Not on file    Inability: Not on file  . Transportation needs:    Medical: Not on file    Non-medical: Not on file  Tobacco Use  . Smoking status: Never Smoker  . Smokeless tobacco: Never Used  Substance and Sexual Activity  . Alcohol use: Yes  . Drug use: No  . Sexual activity: Not on file  Lifestyle  . Physical activity:    Days per week: Not on file    Minutes per session: Not on file  . Stress: Not on file  Relationships  . Social connections:    Talks on phone: Not on file    Gets together: Not on file    Attends religious service: Not on file    Active member of club or organization: Not on file    Attends meetings of clubs or organizations: Not on file    Relationship status: Not on file  . Intimate partner violence:    Fear of current or ex partner: Not on file    Emotionally abused: Not on file    Physically abused: Not on file    Forced sexual activity: Not on file  Other Topics Concern  .  Not on file  Social History Narrative  . Not on file    Past Surgical History:  Procedure Laterality Date  . ABDOMINAL HYSTERECTOMY  1983  . APPENDECTOMY     Age 62  . BREAST SURGERY Bilateral    Removed ducts from both breast  . CHOLECYSTECTOMY  1995  . RIGHT/LEFT HEART CATH AND CORONARY ANGIOGRAPHY N/A 02/06/2018   Procedure: RIGHT/LEFT HEART CATH AND CORONARY ANGIOGRAPHY;  Surgeon: Corky CraftsVaranasi, Jayadeep S, MD;  [facility];  Service: Cardiovascular;  Laterality: N/A;  . SHOULDER SURGERY Right    Rotater cuff repair  . TONSILLECTOMY     Age 62     There is no immunization history on file for this patient.  Current Meds  Medication Sig  . aspirin EC 81 MG tablet Take 81 mg by mouth daily.  Marland Kitchen. gabapentin (NEURONTIN) 300 MG capsule Take 2 capsules (600 mg total) by mouth 3 (three) times daily.  . hydrochlorothiazide (HYDRODIURIL) 25 MG tablet Take 1 tablet  (25 mg total) by mouth daily.  Marland Kitchen. lisinopril (PRINIVIL,ZESTRIL) 20 MG tablet Take 1 tablet (20 mg total) by mouth daily.  . metoprolol succinate (TOPROL-XL) 50 MG 24 hr tablet Take 1 tablet (50 mg total) by mouth daily.  . Vitamin D, Ergocalciferol, (DRISDOL) 50000 units CAPS capsule Take 1 capsule (50,000 Units total) by mouth every 7 (seven) days.   Allergies  Allergen Reactions  . Morphine And Related Hives and Shortness Of Breath  . Statins Other (See Comments)    Muscle cramps  . Xanax [Alprazolam]     Over sedated, possible sleep walking   . Demerol [Meperidine]     Migraines   . Other     [place] Nuts - tongue swelling, mouth itching    BP 110/80 (BP Location: Right Arm, Patient Position: Sitting, Cuff Size: Large)   Pulse 60   Ht 5\' 5"  (1.651 m)   Wt 254 lb 4.8 oz (115.3 kg)   SpO2 100%   BMI 42.32 kg/m   Review of Systems  Constitutional: Negative.   HENT: Negative.   Eyes: Negative.   Respiratory: Negative.   Cardiovascular: Negative.   Gastrointestinal: Negative.   Endocrine: Negative.   Genitourinary: Negative.   Musculoskeletal: Positive for back pain.  Neurological: Positive for headaches (occasional).  Hematological: Negative.   Psychiatric/Behavioral: Negative.        Objective:   Physical Exam  Constitutional: She is oriented to person, place, and time. She appears well-developed and well-nourished.  HENT:  Head: Normocephalic and atraumatic.  Right Ear: External ear normal.  Left Ear: External ear normal.  Nose: Nose normal.  Mouth/Throat: Oropharynx is clear and moist.  Eyes: Pupils are equal, round, and reactive to light. Conjunctivae and EOM are normal.  Neck: Normal range of motion. Neck supple.  Cardiovascular: Normal rate, regular rhythm, normal heart sounds and intact distal pulses.  Pulmonary/Chest: Effort normal and breath sounds normal.  Abdominal: Soft. Bowel sounds are normal.  Musculoskeletal:  Limited ROM in back  Neurological:  She is alert and oriented to person, place, and time.  Skin: Skin is warm and dry. Capillary refill takes less than 2 seconds.  Psychiatric: She has a normal mood and affect. Her behavior is normal. Judgment and thought content normal.   Assessment & Plan:   1. Essential hypertension Blood pressure is stable at 110/80 today. He will continue HCTZ, Lisinopril, and Metoprolol as prescribed. Urinalysis reveals moderated Leukocytes. We will obtain Urine Culture.  -  POCT urinalysis dipstick - CBC with Differential  2. Screening for diabetes mellitus Hgb A1c was within normal range of 5.5 on 12/18/2017. Results from today are pending.  - HgB A1c  3. Healthcare maintenance We will repeat CMET, TSH, and Lipid Panel today. - Comprehensive metabolic panel - TSH - Lipid Panel  4. Hyperlipidemia, unspecified hyperlipidemia type Triglycerides were mildly elevated at 168, HDL was mildly decreased at 33, and LDL was increased at 115 on 01/02/2018. Goal is: Triglycerides < 150, LDL < 100, and HDL > 50. We will reassess Lipid panel today.    5. Follow up She will follow up in 3 months.   No orders of the defined types were placed in this encounter.  Raliegh Ip,  MSN, FNP-BC [facility] [address] [place], [place] [address] [phone]

## 2018-05-02 ENCOUNTER — Other Ambulatory Visit: Payer: Self-pay

## 2018-05-02 LAB — COMPREHENSIVE METABOLIC PANEL
ALT: 11 IU/L (ref 0–32)
AST: 9 IU/L (ref 0–40)
Albumin/Globulin Ratio: 1.6 (ref 1.2–2.2)
Albumin: 4.2 g/dL (ref 3.6–4.8)
Alkaline Phosphatase: 56 IU/L (ref 39–117)
BUN/Creatinine Ratio: 20 (ref 12–28)
BUN: 16 mg/dL (ref 8–27)
Bilirubin Total: <0.2 mg/dL (ref 0.0–1.2)
CO2: 22 mmol/L (ref 20–29)
Calcium: 9.8 mg/dL (ref 8.7–10.3)
Chloride: 101 mmol/L (ref 96–106)
Creatinine, Ser: 0.81 mg/dL (ref 0.57–1.00)
GFR calc Af Amer: 91 mL/min/{1.73_m2} (ref >59)
GFR calc non Af Amer: 79 mL/min/{1.73_m2} (ref >59)
Globulin, Total: 2.7 g/dL (ref 1.5–4.5)
Glucose: 89 mg/dL (ref 65–99)
Potassium: 4.5 mmol/L (ref 3.5–5.2)
Sodium: 139 mmol/L (ref 134–144)
Total Protein: 6.9 g/dL (ref 6.0–8.5)

## 2018-05-02 LAB — CBC WITH DIFFERENTIAL/PLATELET
Basophils Absolute: 0.1 10*3/uL (ref 0.0–0.2)
Basos: 1 %
EOS (ABSOLUTE): 0.7 10*3/uL — ABNORMAL HIGH (ref 0.0–0.4)
Eos: 7 %
Hematocrit: 43.6 % (ref 34.0–46.6)
Hemoglobin: 14.8 g/dL (ref 11.1–15.9)
Immature Grans (Abs): 0 10*3/uL (ref 0.0–0.1)
Immature Granulocytes: 0 %
Lymphocytes Absolute: 3 10*3/uL (ref 0.7–3.1)
Lymphs: 33 %
MCH: 31.2 pg (ref 26.6–33.0)
MCHC: 33.9 g/dL (ref 31.5–35.7)
MCV: 92 fL (ref 79–97)
Monocytes Absolute: 0.6 10*3/uL (ref 0.1–0.9)
Monocytes: 6 %
Neutrophils Absolute: 5 10*3/uL (ref 1.4–7.0)
Neutrophils: 53 %
Platelets: 317 10*3/uL (ref 150–450)
RBC: 4.74 x10E6/uL (ref 3.77–5.28)
RDW: 13.6 % (ref 12.3–15.4)
WBC: 9.3 10*3/uL (ref 3.4–10.8)

## 2018-05-02 LAB — LIPID PANEL
Chol/HDL Ratio: 6.2 ratio — ABNORMAL HIGH (ref 0.0–4.4)
Cholesterol, Total: 198 mg/dL (ref 100–199)
HDL: 32 mg/dL — ABNORMAL LOW (ref >39)
LDL Calculated: 124 mg/dL — ABNORMAL HIGH (ref 0–99)
Triglycerides: 209 mg/dL — ABNORMAL HIGH (ref 0–149)
VLDL Cholesterol Cal: 42 mg/dL — ABNORMAL HIGH (ref 5–40)

## 2018-05-02 LAB — TSH: TSH: 1.39 u[IU]/mL (ref 0.450–4.500)

## 2018-05-05 ENCOUNTER — Telehealth: Payer: 59 | Admitting: Family

## 2018-05-05 DIAGNOSIS — N390 Urinary tract infection, site not specified: Secondary | ICD-10-CM

## 2018-05-05 MED ORDER — CEPHALEXIN 500 MG PO CAPS: 500.0000 mg | ORAL_CAPSULE | Freq: Two times a day (BID) | ORAL | 0 refills | Status: DC

## 2018-05-05 NOTE — Progress Notes (Signed)
Thank you for the details you included in the comment boxes. Those details are very helpful in determining the best course of treatment for you and help us to provide the best care. I am sorry to hear about the situation where there was inadequate follow-up. I will forward this to administration so that we can find out where the communication breakdown may have been.   We are sorry that you are not feeling well.  Here is how we plan to help!  Based on what you shared with me it looks like you most likely have a simple urinary tract infection.  A UTI (Urinary Tract Infection) is a bacterial infection of the bladder.  Most cases of urinary tract infections are simple to treat but a key part of your care is to encourage you to drink plenty of fluids and watch your symptoms carefully.  I have prescribed Keflex 500 mg twice a day for 7 days.  Your symptoms should gradually improve. Call us if the burning in your urine worsens, you develop worsening fever, back pain or pelvic pain or if your symptoms do not resolve after completing the antibiotic.  Urinary tract infections can be prevented by drinking plenty of water to keep your body hydrated.  Also be sure when you wipe, wipe from front to back and don't hold it in!  If possible, empty your bladder every 4 hours.  Your e-visit answers were reviewed by a board certified advanced clinical practitioner to complete your personal care plan.  Depending on the condition, your plan could have included both over the counter or prescription medications.  If there is a problem please reply  once you have received a response from your provider.  Your safety is important to us.  If you have drug allergies check your prescription carefully.    You can use MyChart to ask questions about today's visit, request a non-urgent call back, or ask for a work or school excuse for 24 hours related to this e-Visit. If it has been greater than 24 hours you will need to follow  up with your provider, or enter a new e-Visit to address those concerns.   You will get an e-mail in the next two days asking about your experience.  I hope that your e-visit has been valuable and will speed your recovery. Thank you for using e-visits.

## 2018-05-07 NOTE — Telephone Encounter (Signed)
-----   Message from Kallie LocksNatalie M Stroud, FNP sent at 05/06/2018  7:19 PM EDT ----- Regarding: "Lab Results" Please call patient to inform them to return to office for urine sample. We need to get a Urine Culture because of abnormal Urinalysis.   All other labs are stable.    Thanks!

## 2018-05-07 NOTE — Telephone Encounter (Signed)
Patient notified and is already on Keflex that she was prescribed from a e visit because she was having flank pain. Patient will come in and do a sample once she completes medication

## 2018-05-09 NOTE — Telephone Encounter (Signed)
Patient notified

## 2018-05-09 NOTE — Telephone Encounter (Signed)
-----   Message from Kallie LocksNatalie M Stroud, FNP sent at 05/09/2018  8:03 AM EDT ----- Regarding: "Antibiotic" Please advise patient:  Since she is already on Keflex, it is not necessary for her to give urine sample.  Thanks!

## 2018-05-27 ENCOUNTER — Encounter: Payer: 59 | Admitting: Family

## 2018-05-27 NOTE — Progress Notes (Signed)
Please resubmit the e-visit under your husband's chart. Unfortunately, this is linked to YOUR permanent health record and cannot be completed in your name.

## 2018-05-29 ENCOUNTER — Emergency Department (HOSPITAL_COMMUNITY)
Admission: EM | Admit: 2018-05-29 | Discharge: 2018-05-30 | Disposition: A | Discharge status: Home / Self Care | Payer: 59 | Attending: Emergency Medicine | Admitting: Emergency Medicine

## 2018-05-29 ENCOUNTER — Other Ambulatory Visit: Payer: Self-pay

## 2018-05-29 ENCOUNTER — Emergency Department (HOSPITAL_COMMUNITY): Payer: 59

## 2018-05-29 DIAGNOSIS — I10 Essential (primary) hypertension: Secondary | ICD-10-CM | POA: Diagnosis not present

## 2018-05-29 DIAGNOSIS — R112 Nausea with vomiting, unspecified: Secondary | ICD-10-CM | POA: Diagnosis not present

## 2018-05-29 DIAGNOSIS — R110 Nausea: Secondary | ICD-10-CM | POA: Diagnosis not present

## 2018-05-29 DIAGNOSIS — R4020 Unspecified coma: Secondary | ICD-10-CM | POA: Diagnosis not present

## 2018-05-29 DIAGNOSIS — I252 Old myocardial infarction: Secondary | ICD-10-CM | POA: Insufficient documentation

## 2018-05-29 DIAGNOSIS — R42 Dizziness and giddiness: Secondary | ICD-10-CM | POA: Diagnosis not present

## 2018-05-29 DIAGNOSIS — I2510 Atherosclerotic heart disease of native coronary artery without angina pectoris: Secondary | ICD-10-CM | POA: Diagnosis not present

## 2018-05-29 DIAGNOSIS — R1111 Vomiting without nausea: Secondary | ICD-10-CM | POA: Diagnosis not present

## 2018-05-29 DIAGNOSIS — I959 Hypotension, unspecified: Secondary | ICD-10-CM | POA: Diagnosis not present

## 2018-05-29 LAB — COMPREHENSIVE METABOLIC PANEL
ALBUMIN: 3.9 g/dL (ref 3.5–5.0)
ALT: 16 U/L (ref 0–44)
ANION GAP: 11 (ref 5–15)
AST: 15 U/L (ref 15–41)
Alkaline Phosphatase: 48 U/L (ref 38–126)
BUN: 21 mg/dL (ref 8–23)
CHLORIDE: 107 mmol/L (ref 98–111)
CO2: 24 mmol/L (ref 22–32)
Calcium: 9.6 mg/dL (ref 8.9–10.3)
Creatinine, Ser: 0.82 mg/dL (ref 0.44–1.00)
GFR calc Af Amer: >60 mL/min (ref >60)
Glucose, Bld: 117 mg/dL — ABNORMAL HIGH (ref 70–99)
POTASSIUM: 4.2 mmol/L (ref 3.5–5.1)
Sodium: 142 mmol/L (ref 135–145)
Total Bilirubin: 0.7 mg/dL (ref 0.3–1.2)
Total Protein: 7.3 g/dL (ref 6.5–8.1)

## 2018-05-29 LAB — CBC WITH DIFFERENTIAL/PLATELET
BASOS ABS: 0 10*3/uL (ref 0.0–0.1)
BASOS PCT: 0 %
EOS ABS: 0.2 10*3/uL (ref 0.0–0.7)
Eosinophils Relative: 3 %
HCT: 44.8 % (ref 36.0–46.0)
Hemoglobin: 15.1 g/dL — ABNORMAL HIGH (ref 12.0–15.0)
Lymphocytes Relative: 19 %
Lymphs Abs: 1.7 10*3/uL (ref 0.7–4.0)
MCH: 31.8 pg (ref 26.0–34.0)
MCHC: 33.7 g/dL (ref 30.0–36.0)
MCV: 94.3 fL (ref 78.0–100.0)
MONO ABS: 0.4 10*3/uL (ref 0.1–1.0)
Monocytes Relative: 5 %
Neutro Abs: 6.8 10*3/uL (ref 1.7–7.7)
Neutrophils Relative %: 73 %
PLATELETS: 255 10*3/uL (ref 150–400)
RBC: 4.75 MIL/uL (ref 3.87–5.11)
RDW: 13.7 % (ref 11.5–15.5)
WBC: 9.2 10*3/uL (ref 4.0–10.5)

## 2018-05-29 LAB — TROPONIN I

## 2018-05-29 MED ORDER — LORAZEPAM 2 MG/ML IJ SOLN
1.0000 mg | Freq: Once | INTRAMUSCULAR | Status: AC
  Administered 2018-05-29: 1 mg via INTRAVENOUS
  Filled 2018-05-29: qty 1

## 2018-05-29 MED ORDER — PROMETHAZINE HCL 25 MG/ML IJ SOLN
12.5000 mg | Freq: Once | INTRAMUSCULAR | Status: AC
  Administered 2018-05-29: 12.5 mg via INTRAVENOUS
  Filled 2018-05-29: qty 1

## 2018-05-29 MED ORDER — MECLIZINE HCL 25 MG PO TABS
25.0000 mg | ORAL_TABLET | Freq: Once | ORAL | Status: AC
  Administered 2018-05-29: 25 mg via ORAL
  Filled 2018-05-29: qty 1

## 2018-05-29 MED ORDER — ONDANSETRON HCL 4 MG/2ML IJ SOLN
4.0000 mg | Freq: Once | INTRAMUSCULAR | Status: AC
  Administered 2018-05-29: 4 mg via INTRAVENOUS
  Filled 2018-05-29: qty 2

## 2018-05-29 NOTE — ED Notes (Signed)
Pt is allergic to Morphine and xanx but can take ativan

## 2018-05-29 NOTE — ED Triage Notes (Signed)
Pt to ED via [facility] w/o of dizziness that started today. Pt states that she has had vertigo in the past and her dizziness gets worse with movement. Pt states she is having emesis and nausea as well. Pt denies any pain. Pt is A&O x4. Pt hx of HTN and Hyperlipidemia.

## 2018-05-29 NOTE — ED Notes (Signed)
Attempted to help patient use restroom. Patient too somnolent to follow directions or help with urination. Instructions given to call out when patient is ready.

## 2018-05-29 NOTE — ED Provider Notes (Signed)
[facility]-EMERGENCY DEPT Provider Note   CSN: [id number] Arrival date & time: 05/29/18  1955     History   Chief Complaint Chief Complaint  Patient presents with  . Dizziness    HPI Deanna Dudley is a 62 y.o. female.  Pt presents to the ED today with dizziness.  The pt said dizziness started a few hrs ago.  She has a hx of vertigo and this feels the same.  She has had MRI for it and has seen neurology.  Dizziness worse with movement.  Pt is also having n/c.  No f/c.  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  Pt had a recent uti and took abx.  She does not feel like sx have gone away.     Past Medical History:  Diagnosis Date  . CAD (coronary artery disease), native coronary artery    s/p Ang[place]oplast only in 2011 @ [facility]  . Hyperlipidemia LDL goal <70   . Hypertension   . Muscle spasm   . Myocardial infarction (HCC) 2011  . Syncope   . Tobacco abuse     Patient Active Problem List   Diagnosis Date Noted  . Angina pectoris (HCC)   . Paresthesia 02/05/2018  . Hypertension   . Muscle spasm   . Myocardial infarction (HCC) 11/20/2009    Past Surgical History:  Procedure Laterality Date  . ABDOMINAL HYSTERECTOMY  1983  . APPENDECTOMY     Age 62  . BREAST SURGERY Bilateral    Removed ducts from both breast  . CHOLECYSTECTOMY  1995  . RIGHT/LEFT HEART CATH AND CORONARY ANGIOGRAPHY N/A 02/06/2018   Procedure: RIGHT/LEFT HEART CATH AND CORONARY ANGIOGRAPHY;  Surgeon: Corky CraftsVaranasi, Jayadeep S, MD;  [facility];  Service: Cardiovascular;  Laterality: N/A;  . SHOULDER SURGERY Right    Rotater cuff repair  . TONSILLECTOMY     Age 62     OB History   None      Home Medications    Prior to Admission medications   Medication Sig Start Date End Date Taking? Authorizing Provider  metoprolol succinate (TOPROL-XL) 50 MG 24 hr tablet Take 1 tablet (50 mg total) by mouth daily. 12/25/17  Yes Bing NeighborsHarris, Kimberly S, FNP  ASPERCREME LIDOCAINE EX  Apply 1 application topically 4 (four) times daily as needed (pain).    [provider]  aspirin EC 81 MG tablet Take 81 mg by mouth daily.    [provider]  calcium carbonate (TUMS - DOSED IN MG ELEMENTAL CALCIUM) 500 MG chewable tablet Chew 2 tablets by mouth as needed for indigestion or heartburn.    [provider]  cephALEXin (KEFLEX) 500 MG capsule Take 1 capsule (500 mg total) by mouth 2 (two) times daily. 05/05/18   Withrow, Everardo AllJohn C, FNP  cyclobenzaprine (FLEXERIL) 10 MG tablet Take 1 tablet (10 mg total) by mouth 3 (three) times daily as needed for muscle spasms. Patient not taking: Reported on 05/01/2018 01/02/18   Bing NeighborsHarris, Kimberly S, FNP  gabapentin (NEURONTIN) 300 MG capsule Take 2 capsules (600 mg total) by mouth 3 (three) times daily. 03/06/18   Levert FeinsteinYan, Yijun, MD  hydrochlorothiazide (HYDRODIURIL) 25 MG tablet Take 1 tablet (25 mg total) by mouth daily. 01/31/18 05/01/18  Manson PasseyBhagat, Bhavinkumar, PA  hydrOXYzine (ATARAX/VISTARIL) 50 MG tablet Take 1 tablet (50 mg total) by mouth 3 (three) times daily. Patient not taking: Reported on 05/01/2018 01/29/18   Bing NeighborsHarris, Kimberly S, FNP  lisinopril (PRINIVIL,ZESTRIL) 20 MG  tablet Take 1 tablet (20 mg total) by mouth daily. 01/02/18   Bing Neighbors, FNP  naproxen sodium (ALEVE) 220 MG tablet Take 220 mg by mouth 2 (two) times daily as needed (pain).    [provider]  nitroGLYCERIN (NITROSTAT) 0.4 MG SL tablet Place 1 tablet (0.4 mg total) under the tongue every 5 (five) minutes as needed for chest pain. Patient not taking: Reported on 05/01/2018 01/31/18 05/01/18  Manson Passey, PA  Vitamin D, Ergocalciferol, (DRISDOL) 50000 units CAPS capsule Take 1 capsule (50,000 Units total) by mouth every 7 (seven) days. 01/31/18   Bing Neighbors, FNP    Family History Family History  Problem Relation Age of Onset  . Stroke Mother   . Colon cancer Mother   . Lung cancer Mother   . Hypothyroidism Father   . Diabetes  Father   . CAD Father   . Heart disease Maternal Grandfather   . Clotting disorder Maternal Grandfather   . Heart disease Paternal Grandmother     Social History Social History   Tobacco Use  . Smoking status: Never Smoker  . Smokeless tobacco: Never Used  Substance Use Topics  . Alcohol use: Yes  . Drug use: No     Allergies   Morphine and related; Statins; Xanax [alprazolam]; Demerol [meperidine]; and Other   Review of Systems Review of Systems  Gastrointestinal: Positive for nausea and vomiting.  Neurological: Positive for dizziness.  All other systems reviewed and are negative.    Physical Exam Updated Vital Signs BP 115/82   Pulse 71   Temp (!) 96.8 F (36 C) (Rectal)   Resp 18   Ht 5\' 5"  (1.651 m)   Wt 115.2 kg (254 lb)   SpO2 96%   BMI 42.27 kg/m   Physical Exam  Constitutional: She is oriented to person, place, and time. She appears well-developed and well-nourished.  HENT:  Head: Normocephalic and atraumatic.  Right Ear: External ear normal.  Left Ear: External ear normal.  Nose: Nose normal.  Mouth/Throat: Oropharynx is clear and moist.  Eyes: Pupils are equal, round, and reactive to light. Conjunctivae are normal. Right eye exhibits nystagmus. Left eye exhibits nystagmus.  Neck: Normal range of motion. Neck supple.  Cardiovascular: Normal rate, regular rhythm, normal heart sounds and intact distal pulses.  Pulmonary/Chest: Effort normal and breath sounds normal.  Abdominal: Soft. Bowel sounds are normal.  Musculoskeletal: Normal range of motion.  Neurological: She is alert and oriented to person, place, and time.  Skin: Skin is warm. Capillary refill takes less than 2 seconds.  Psychiatric: She has a normal mood and affect. Her behavior is normal. Judgment and thought content normal.  Nursing note and vitals reviewed.    ED Treatments / Results  Labs (all labs ordered are listed, but only abnormal results are displayed) Labs Reviewed    COMPREHENSIVE METABOLIC PANEL - Abnormal; Notable for the following components:      Result Value   Glucose, Bld 117 (*)    All other components within normal limits  CBC WITH DIFFERENTIAL/PLATELET - Abnormal; Notable for the following components:   Hemoglobin 15.1 (*)    All other components within normal limits  TROPONIN I  URINALYSIS, ROUTINE W REFLEX MICROSCOPIC    EKG EKG Interpretation  Date/Time:  Wednesday May 29 2018 20:55:19 EDT Ventricular Rate:  58 PR Interval:    QRS Duration: 96 QT Interval:  462 QTC Calculation: 454 R Axis:   -20 Text Interpretation:  Sinus  rhythm Supraventricular bigeminy Borderline left axis deviation Borderline low voltage, extremity leads No old tracing to compare Confirmed by Jacalyn Lefevre [phone]) on 05/29/2018 9:00:06 PM   Radiology Ct Head Wo Contrast  Result Date: 05/29/2018 CLINICAL DATA:  Altered level of consciousness. Dizziness, onset today. Nausea and vomiting. EXAM: CT HEAD WITHOUT CONTRAST TECHNIQUE: Contiguous axial images were obtained from the base of the skull through the vertex without intravenous contrast. COMPARISON:  None. FINDINGS: Brain: No intracranial hemorrhage, mass effect, or midline shift. No hydrocephalus. The basilar cisterns are patent. No evidence of territorial infarct or acute ischemia. No extra-axial or intracranial fluid collection. Vascular: No hyperdense vessel or unexpected calcification. Skull: No fracture or focal lesion. Sinuses/Orbits: Paranasal sinuses and mastoid air cells are clear. The visualized orbits are unremarkable. Other: None. IMPRESSION: Negative head CT. Electronically Signed   By: Rubye Oaks M.D.   On: 05/29/2018 21:03    Procedures Procedures (including critical care time)  Medications Ordered in ED Medications  ondansetron (ZOFRAN) injection 4 mg (4 mg Intravenous Given 05/29/18 2112)  LORazepam (ATIVAN) injection 1 mg (1 mg Intravenous Given 05/29/18 2112)  LORazepam (ATIVAN)  injection 1 mg (1 mg Intravenous Given 05/29/18 2222)  promethazine (PHENERGAN) injection 12.5 mg (12.5 mg Intravenous Given 05/29/18 2222)  meclizine (ANTIVERT) tablet 25 mg (25 mg Oral Given 05/29/18 2222)     Initial Impression / Assessment and Plan / ED Course  I have reviewed the triage vital signs and the nursing notes.  Pertinent labs & imaging results that were available during my care of the patient were reviewed by me and considered in my medical decision making (see chart for details).    Pt is feeling better, but is very sedated.  Pt still unable to give UA.  Pt to be signed out to Dr. Eudelia Bunch.    Final Clinical Impressions(s) / ED Diagnoses   Final diagnoses:  Vertigo    ED Discharge Orders    None       Jacalyn Lefevre, MD 05/29/18 2354

## 2018-05-30 ENCOUNTER — Other Ambulatory Visit: Payer: Self-pay

## 2018-05-30 ENCOUNTER — Telehealth: Payer: 59 | Admitting: Physician Assistant

## 2018-05-30 DIAGNOSIS — R42 Dizziness and giddiness: Secondary | ICD-10-CM | POA: Diagnosis not present

## 2018-05-30 DIAGNOSIS — R112 Nausea with vomiting, unspecified: Secondary | ICD-10-CM

## 2018-05-30 DIAGNOSIS — I2510 Atherosclerotic heart disease of native coronary artery without angina pectoris: Secondary | ICD-10-CM | POA: Diagnosis not present

## 2018-05-30 DIAGNOSIS — I10 Essential (primary) hypertension: Secondary | ICD-10-CM | POA: Diagnosis not present

## 2018-05-30 DIAGNOSIS — I252 Old myocardial infarction: Secondary | ICD-10-CM | POA: Diagnosis not present

## 2018-05-30 LAB — URINALYSIS, ROUTINE W REFLEX MICROSCOPIC
BILIRUBIN URINE: NEGATIVE
Bacteria, UA: NONE SEEN
Glucose, UA: NEGATIVE mg/dL
Ketones, ur: NEGATIVE mg/dL
LEUKOCYTES UA: NEGATIVE
NITRITE: NEGATIVE
PH: 5 (ref 5.0–8.0)
Protein, ur: NEGATIVE mg/dL
SPECIFIC GRAVITY, URINE: 1.01 (ref 1.005–1.030)

## 2018-05-30 NOTE — ED Notes (Signed)
Husband contacted for discharge 

## 2018-05-30 NOTE — Progress Notes (Signed)
Based on what you shared with me it looks like you have a condition that should be evaluated in a face to face office visit. Per your ER assessment, it is recommended that you follow-up with your primary care provider before the end of the week. If you have not already scheduled an appointment, I recommend you do so first thing in the morning. I will write you a note to excuse you tomorrow as I think that is more than reasonable. Please continue care directed by ER physician until you follow-up with your provider.    NOTE: If you entered your credit card information for this eVisit, you will not be charged. You may see a "hold" on your card for the $30 but that hold will drop off and you will not have a charge processed.  If you are having a true medical emergency please call 911.  If you need an urgent face to face visit, [facility] has four urgent care centers for your convenience.  If you need care fast and have a high deductible or no insurance consider:   [email or web address] to reserve your spot online an avoid wait times  [facility] [address], [address] [place], [place] [address] 8 am to 8 pm Monday-Friday 10 am to 4 pm Saturday-Sunday *Across the street from [employer]  [facility]  [address] [place], [address] 8 am to 5 pm Monday-Friday * In the [facility] on the [facility]   The following sites will take your  insurance:  . [facility]  [phone] Get Driving Directions Find a Provider at this Location  [address] [place], [place] [address] . 10 am to 8 pm Monday-Friday . 12 pm to 8 pm Saturday-Sunday   . [facility]  [phone] Get Driving Directions Find a Provider at this Location  [address] [place], [place] [address] . 8 am to 8 pm Monday-Friday . 9 am to 6 pm Saturday . 11 am to 6 pm Sunday   . [facility] at [facility] [place]  [phone] Get Driving Directions  [address] [place], [place] [address] . 8 am to 8 pm Monday-Friday . 8 am to 4 pm Saturday-Sunday   Your e-visit answers were reviewed by a board certified advanced clinical practitioner to complete your personal care plan.  Thank you for using e-Visits.

## 2018-05-30 NOTE — ED Provider Notes (Signed)
I assumed care of this patient from Dr. Particia NearingHaviland at 0000.  Please see their note for further details of Hx, PE.  Briefly patient is a 62 y.o. female who presented with vertigo. Given treatment. Will reassess. Also getting repeat UA to ensure adequate treatment from recent infection.   UA negative.  Symptomatically improved. Stable ambulation.  The patient is safe for discharge with strict return precautions.  Disposition: Discharge  Condition: Good  I have discussed the results, Dx and Tx plan with the patient who expressed understanding and agree(s) with the plan. Discharge instructions discussed at great length. The patient was given strict return precautions who verbalized understanding of the instructions. No further questions at time of discharge.    ED Discharge Orders    None       Follow Up: Kallie LocksStroud, Natalie M, FNP [address] [place] [address] [phone]  Schedule an appointment as soon as possible for a visit in 2 days As needed        Nira Connardama, Sulema Braid Eduardo, MD 05/30/18 [phone]

## 2018-05-30 NOTE — ED Notes (Signed)
Unable to get urine sample from patient. Patient found already in bathroom and had already used it.

## 2018-06-04 ENCOUNTER — Encounter: Payer: Self-pay | Admitting: Family Medicine

## 2018-06-05 ENCOUNTER — Ambulatory Visit (INDEPENDENT_AMBULATORY_CARE_PROVIDER_SITE_OTHER): Payer: 59 | Admitting: Family Medicine

## 2018-06-05 ENCOUNTER — Encounter: Payer: Self-pay | Admitting: Family Medicine

## 2018-06-05 VITALS — BP 114/62 | HR 68 | Temp 98.2°F | Ht 65.0 in | Wt 257.0 lb

## 2018-06-05 DIAGNOSIS — R110 Nausea: Secondary | ICD-10-CM

## 2018-06-05 DIAGNOSIS — R42 Dizziness and giddiness: Secondary | ICD-10-CM | POA: Diagnosis not present

## 2018-06-05 DIAGNOSIS — Z09 Encounter for follow-up examination after completed treatment for conditions other than malignant neoplasm: Secondary | ICD-10-CM

## 2018-06-05 DIAGNOSIS — R609 Edema, unspecified: Secondary | ICD-10-CM

## 2018-06-05 MED ORDER — MECLIZINE HCL 25 MG PO TABS: 25.0000 mg | ORAL_TABLET | Freq: Three times a day (TID) | ORAL | 1 refills | Status: DC | PRN

## 2018-06-05 MED ORDER — ONDANSETRON HCL 4 MG PO TABS: 4.0000 mg | ORAL_TABLET | Freq: Three times a day (TID) | ORAL | 1 refills | Status: DC | PRN

## 2018-06-05 MED ORDER — FUROSEMIDE 20 MG PO TABS: 20.0000 mg | ORAL_TABLET | Freq: Every day | ORAL | 2 refills | Status: DC

## 2018-06-05 MED FILL — MECLIZINE 25 MG TABLET: 25 | 30 days supply | Qty: 90 | Fill #0

## 2018-06-05 MED FILL — ONDANSETRON HCL 4 MG TABLET: 4 | 10 days supply | Qty: 30 | Fill #0

## 2018-06-05 MED FILL — FUROSEMIDE 20 MG TABS: 20 | 30 days supply | Qty: 30 | Fill #0

## 2018-06-05 NOTE — Patient Instructions (Addendum)
Furosemide tablets What is this medicine? FUROSEMIDE (fyoor OH se mide) is a diuretic. It helps you make more urine and to lose salt and excess water from your body. This medicine is used to treat high blood pressure, and edema or swelling from heart, kidney, or liver disease. This medicine may be used for other purposes; ask your health care provider or pharmacist if you have questions. COMMON BRAND NAME(S): Active-Medicated Specimen Kit, Delone, Diuscreen, Lasix, RX Specimen Collection Kit, Specimen Collection Kit, URINX Medicated Specimen Collection What should I tell my health care provider before I take this medicine? They need to know if you have any of these conditions: -abnormal blood electrolytes -diarrhea or vomiting -gout -heart disease -kidney disease, small amounts of urine, or difficulty passing urine -liver disease -thyroid disease -an unusual or allergic reaction to furosemide, sulfa drugs, other medicines, foods, dyes, or preservatives -pregnant or trying to get pregnant -breast-feeding How should I use this medicine? Take this medicine by mouth with a glass of water. Follow the directions on the prescription label. You may take this medicine with or without food. If it upsets your stomach, take it with food or milk. Do not take your medicine more often than directed. Remember that you will need to pass more urine after taking this medicine. Do not take your medicine at a time of day that will cause you problems. Do not take at bedtime. Talk to your pediatrician regarding the use of this medicine in children. While this drug may be prescribed for selected conditions, precautions do apply. Overdosage: If you think you have taken too much of this medicine contact a poison control center or emergency room at once. NOTE: This medicine is only for you. Do not share this medicine with others. What if I miss a dose? If you miss a dose, take it as soon as you can. If it is almost time  for your next dose, take only that dose. Do not take double or extra doses. What may interact with this medicine? -aspirin and aspirin-like medicines -certain antibiotics -chloral hydrate -cisplatin -cyclosporine -digoxin -diuretics -laxatives -lithium -medicines for blood pressure -medicines that relax muscles for surgery -methotrexate -NSAIDs, medicines for pain and inflammation like ibuprofen, naproxen, or indomethacin -phenytoin -steroid medicines like prednisone or cortisone -sucralfate -thyroid hormones This list may not describe all possible interactions. Give your health care provider a list of all the medicines, herbs, non-prescription drugs, or dietary supplements you use. Also tell them if you smoke, drink alcohol, or use illegal drugs. Some items may interact with your medicine. What should I watch for while using this medicine? Visit your doctor or health care professional for regular checks on your progress. Check your blood pressure regularly. Ask your doctor or health care professional what your blood pressure should be, and when you should contact him or her. If you are a diabetic, check your blood sugar as directed. You may need to be on a special diet while taking this medicine. Check with your doctor. Also, ask how many glasses of fluid you need to drink a day. You must not get dehydrated. You may get drowsy or dizzy. Do not drive, use machinery, or do anything that needs mental alertness until you know how this drug affects you. Do not stand or sit up quickly, especially if you are an older patient. This reduces the risk of dizzy or fainting spells. Alcohol can make you more drowsy and dizzy. Avoid alcoholic drinks. This medicine can make you more sensitive  to the sun. Keep out of the sun. If you cannot avoid being in the sun, wear protective clothing and use sunscreen. Do not use sun lamps or tanning beds/booths. What side effects may I notice from receiving this  medicine? Side effects that you should report to your doctor or health care professional as soon as possible: -blood in urine or stools -dry mouth -fever or chills -hearing loss or ringing in the ears -irregular heartbeat -muscle pain or weakness, cramps -skin rash -stomach upset, pain, or nausea -tingling or numbness in the hands or feet -unusually weak or tired -vomiting or diarrhea -yellowing of the eyes or skin Side effects that usually do not require medical attention (report to your doctor or health care professional if they continue or are bothersome): -headache -loss of appetite -unusual bleeding or bruising This list may not describe all possible side effects. Call your doctor for medical advice about side effects. You may report side effects to FDA at 1-800-FDA-1088. Where should I keep my medicine? Keep out of the reach of children. Store at room temperature between 15 and 30 degrees C (59 and 86 degrees F). Protect from light. Throw away any unused medicine after the expiration date. NOTE: This sheet is a summary. It may not cover all possible information. If you have questions about this medicine, talk to your doctor, pharmacist, or health care provider.  2018 Elsevier/Gold Standard (2015-01-27 13:49:50)  Vertigo Vertigo means that you feel like you are moving when you are not. Vertigo can also make you feel like things around you are moving when they are not. This feeling can come and go at any time. Vertigo often goes away on its own. Follow these instructions at home:  Avoid making fast movements.  Avoid driving.  Avoid using heavy machinery.  Avoid doing any task or activity that might cause danger to you or other people if you would have a vertigo attack while you are doing it.  Sit down right away if you feel dizzy or have trouble with your balance.  Take over-the-counter and prescription medicines only as told by your doctor.  Follow instructions from  your doctor about which positions or movements you should avoid.  Drink enough fluid to keep your pee (urine) clear or pale yellow.  Keep all follow-up visits as told by your doctor. This is important. Contact a doctor if:  Medicine does not help your vertigo.  You have a fever.  Your problems get worse or you have new symptoms.  Your family or friends see changes in your behavior.  You feel sick to your stomach (nauseous) or you throw up (vomit).  You have a "pins and needles" feeling or you are numb in part of your body. Get help right away if:  You have trouble moving or talking.  You are always dizzy.  You pass out (faint).  You get very bad headaches.  You feel weak or have trouble using your hands, arms, or legs.  You have changes in your hearing.  You have changes in your seeing (vision).  You get a stiff neck.  Bright light starts to bother you. This information is not intended to replace advice given to you by your health care provider. Make sure you discuss any questions you have with your health care provider. Document Released: 08/15/2008 Document Revised: 04/13/2016 Document Reviewed: 03/01/2015 Elsevier Interactive Patient Education  [employer].

## 2018-06-05 NOTE — Progress Notes (Signed)
ED Follow Up  Subjective:    Patient ID: Deanna Dudley, female    DOB: 1956-10-08, 62 y.o.   MRN: [id number]   PCP: Raliegh Ip, NP  Chief Complaint  Patient presents with  . Hospitalization Follow-up    HPI  Ms. Deanna Dudley has a past medial history of Tobacco Abuse, Syncope, MI, Hypertension, Hyperlipidemia, and CAD. She is here today for ED follow up.   Current Status: Since her last office visit, she has had ED visit on 05/29/2018 for vertigo. She states that she began to have lightheadedness and dizziness so she went to ED. She has a history of vertigo and previously saw Neurologist, Dr. Terrace Arabia. States that all tests were negative, so she has not followed up with her.   She denies fevers, chills, fatigue, recent infections, weight loss, and night sweats.   She has not had any headaches, visual changes, dizziness, and falls. No chest pain, heart palpitations, cough and shortness of breath reported.   She reports nausea. No reports of any other GI problems such as vomiting, diarrhea, and constipation. She has no reports of blood in stools, dysuria and hematuria.   No depression or anxiety.   She denies pain today.   Past Medical History:  Diagnosis Date  . CAD (coronary artery disease), native coronary artery    s/p Angioplast only in 2011 @ [place]  . Hyperlipidemia LDL goal <70   . Hypertension   . Muscle spasm   . Myocardial infarction (HCC) 2011  . Syncope   . Tobacco abuse     Family History  Problem Relation Age of Onset  . Stroke Mother   . Colon cancer Mother   . Lung cancer Mother   . Hypothyroidism Father   . Diabetes Father   . CAD Father   . Heart disease Maternal Grandfather   . Clotting disorder Maternal Grandfather   . Heart disease Paternal Grandmother     Social History   Socioeconomic History  . Marital status: Married    Spouse name: Not on file  . Number of children: Not on file  . Years of education: Not on file  . Highest education level:  Not on file  Occupational History  . Not on file  Social Needs  . Financial resource strain: Not on file  . Food insecurity:    Worry: Not on file    Inability: Not on file  . Transportation needs:    Medical: Not on file    Non-medical: Not on file  Tobacco Use  . Smoking status: Never Smoker  . Smokeless tobacco: Never Used  Substance and Sexual Activity  . Alcohol use: Yes  . Drug use: No  . Sexual activity: Not on file  Lifestyle  . Physical activity:    Days per week: Not on file    Minutes per session: Not on file  . Stress: Not on file  Relationships  . Social connections:    Talks on phone: Not on file    Gets together: Not on file    Attends religious service: Not on file    Active member of club or organization: Not on file    Attends meetings of clubs or organizations: Not on file    Relationship status: Not on file  . Intimate partner violence:    Fear of current or ex partner: Not on file    Emotionally abused: Not on file    Physically abused: Not on file    Forced  sexual activity: Not on file  Other Topics Concern  . Not on file  Social History Narrative  . Not on file    Past Surgical History:  Procedure Laterality Date  . ABDOMINAL HYSTERECTOMY  1983  . APPENDECTOMY     Age 28  . BREAST SURGERY Bilateral    Removed ducts from both breast  . CHOLECYSTECTOMY  1995  . RIGHT/LEFT HEART CATH AND CORONARY ANGIOGRAPHY N/A 02/06/2018   Procedure: RIGHT/LEFT HEART CATH AND CORONARY ANGIOGRAPHY;  Surgeon: Corky Crafts, MD;  Location: [facility] INVASIVE CV LAB;  Service: Cardiovascular;  Laterality: N/A;  . SHOULDER SURGERY Right    Rotater cuff repair  . TONSILLECTOMY     Age 71     There is no immunization history on file for this patient.  Current Meds  Medication Sig  . aspirin EC 81 MG tablet Take 81 mg by mouth daily.  Marland Kitchen gabapentin (NEURONTIN) 300 MG capsule Take 2 capsules (600 mg total) by mouth 3 (three) times daily.  Marland Kitchen lisinopril  (PRINIVIL,ZESTRIL) 20 MG tablet Take 1 tablet (20 mg total) by mouth daily.  . metoprolol succinate (TOPROL-XL) 50 MG 24 hr tablet Take 1 tablet (50 mg total) by mouth daily.  . Vitamin D, Ergocalciferol, (DRISDOL) 50000 units CAPS capsule Take 1 capsule (50,000 Units total) by mouth every 7 (seven) days.    Allergies  Allergen Reactions  . Morphine And Related Hives and Shortness Of Breath  . Statins Other (See Comments)    Muscle cramps  . Xanax [Alprazolam]     Over sedated, possible sleep walking   . Demerol [Meperidine]     Migraines   . Other     [place] Nuts - tongue swelling, mouth itching     BP 114/62 (BP Location: Left Arm, Patient Position: Sitting, Cuff Size: Large)   Pulse 68   Temp 98.2 F (36.8 C) (Oral)   Ht 5\' 5"  (1.651 m)   Wt 257 lb (116.6 kg)   SpO2 98%   BMI 42.77 kg/m    Review of Systems  Constitutional: Negative.   HENT: Negative.   Eyes: Negative.   Respiratory: Negative.   Cardiovascular: Negative.   Gastrointestinal: Positive for nausea.  Genitourinary: Negative.   Musculoskeletal: Negative.   Skin: Negative.   Allergic/Immunologic: Negative.   Neurological: Positive for dizziness and light-headedness.  Hematological: Negative.   Psychiatric/Behavioral: Negative.    Objective:   Physical Exam  Constitutional: She is oriented to person, place, and time. She appears well-developed and well-nourished.  HENT:  Head: Normocephalic and atraumatic.  Right Ear: External ear normal.  Left Ear: External ear normal.  Nose: Nose normal.  Mouth/Throat: Oropharynx is clear and moist.  Eyes: Pupils are equal, round, and reactive to light. Conjunctivae and EOM are normal.  Neck: Normal range of motion. Neck supple.  Cardiovascular: Normal rate, regular rhythm, normal heart sounds and intact distal pulses.  Pulmonary/Chest: Effort normal and breath sounds normal.  Abdominal: Soft. Bowel sounds are normal.  Musculoskeletal: Normal range of motion.   Neurological: She is alert and oriented to person, place, and time.  Skin: Capillary refill takes less than 2 seconds.  Psychiatric: She has a normal mood and affect. Her behavior is normal. Judgment and thought content normal.  Nursing note and vitals reviewed.  Assessment & Plan:   1. Vertigo Slow ambulation to prevents falls. New Rx for Meclizine today.  2. Dizziness We will initiate Meclizine.  - meclizine (ANTIVERT) 25 MG tablet; Take  1 tablet (25 mg total) by mouth 3 (three) times daily as needed for dizziness.  Dispense: 90 tablet;/ Refill: 1  3. Nausea - ondansetron (ZOFRAN) 4 MG tablet; Take 1 tablet (4 mg total) by mouth every 8 (eight) hours as needed for nausea or vomiting.  Dispense: 30 tablet; Refill: 1  4. Edema, unspecified type She states that HCTZ is ineffective. We will discontinue HCTZ 25 mg and initiate Lasix 20 mg today.  - furosemide (LASIX) 20 MG tablet; Take 1 tablet (20 mg total) by mouth daily.  Dispense: 30 tablet; Refill: 2  5. Follow up Keep follow up appointment on 07/2018.  Meds ordered this encounter  Medications  . meclizine (ANTIVERT) 25 MG tablet    Sig: Take 1 tablet (25 mg total) by mouth 3 (three) times daily as needed for dizziness.    Dispense:  90 tablet    Refill:  1  . ondansetron (ZOFRAN) 4 MG tablet    Sig: Take 1 tablet (4 mg total) by mouth every 8 (eight) hours as needed for nausea or vomiting.    Dispense:  30 tablet    Refill:  1  . furosemide (LASIX) 20 MG tablet    Sig: Take 1 tablet (20 mg total) by mouth daily.    Dispense:  30 tablet    Refill:  2    Raliegh IpNatalie Glennice Marcos,  MSN, FNP-C Patient [facility] Center [facility] [address]  [place], [place] [address] [phone]

## 2018-06-18 ENCOUNTER — Encounter: Payer: Self-pay | Admitting: Family Medicine

## 2018-06-19 ENCOUNTER — Encounter: Payer: Self-pay | Admitting: Family Medicine

## 2018-06-25 MED FILL — LISINOPRIL 20 MG TABLET: 20 | 90 days supply | Qty: 90 | Fill #1

## 2018-07-05 ENCOUNTER — Telehealth: Payer: Self-pay

## 2018-07-05 NOTE — Telephone Encounter (Signed)
Duplicate

## 2018-07-05 NOTE — Telephone Encounter (Signed)
Patient was upset because her paperwork was not complete before the time that she needed it and wants to speak with the Manager.

## 2018-07-08 ENCOUNTER — Encounter: Payer: Self-pay | Admitting: Family Medicine

## 2018-07-08 ENCOUNTER — Telehealth: Payer: Self-pay | Admitting: Family Medicine

## 2018-07-08 ENCOUNTER — Telehealth: Payer: Self-pay

## 2018-07-08 NOTE — Telephone Encounter (Signed)
Spoke with patient and let her know paperwork was resent

## 2018-07-08 NOTE — Progress Notes (Signed)
Contacted [employer] representative today for addition information concerning FMLA forms.

## 2018-07-09 ENCOUNTER — Other Ambulatory Visit: Payer: Self-pay | Admitting: Family Medicine

## 2018-07-09 MED FILL — GEMFIBROZIL 600 MG TABS: 600 | 90 days supply | Qty: 180 | Fill #1

## 2018-07-09 MED FILL — GABAPENTIN 300 MG CAPSULE: 300 | 90 days supply | Qty: 540 | Fill #1

## 2018-07-09 MED FILL — FUROSEMIDE 20 MG TABS: 20 | 30 days supply | Qty: 30 | Fill #1

## 2018-07-09 MED FILL — METOPROLOL SUCCINATE ER 50: 50 | 90 days supply | Qty: 90 | Fill #0

## 2018-08-06 ENCOUNTER — Ambulatory Visit: Payer: 59 | Admitting: Family Medicine

## 2018-09-26 ENCOUNTER — Encounter

## 2019-02-04 ENCOUNTER — Other Ambulatory Visit: Payer: Self-pay

## 2019-03-06 ENCOUNTER — Telehealth: Payer: 59 | Admitting: Physician Assistant

## 2019-03-06 DIAGNOSIS — Z760 Encounter for issue of repeat prescription: Secondary | ICD-10-CM

## 2019-03-06 NOTE — Progress Notes (Signed)
We do not provide refills of chronic medications via e-visit. You will need to contact your primary care provider's office to see if they will give you a supply until you can be seen or if you can be seen by another provider at the office. Most practices are doing phone and video visits currently. You will need to contact them ASAP.

## 2019-03-07 ENCOUNTER — Telehealth: Payer: Self-pay

## 2019-03-07 NOTE — Telephone Encounter (Signed)
Last appointment was 06/05/2018.

## 2019-03-11 NOTE — Telephone Encounter (Signed)
Patient scheduled appointment.

## 2019-03-12 ENCOUNTER — Other Ambulatory Visit: Payer: Self-pay

## 2019-03-12 ENCOUNTER — Telehealth: Payer: Self-pay

## 2019-03-12 ENCOUNTER — Ambulatory Visit (INDEPENDENT_AMBULATORY_CARE_PROVIDER_SITE_OTHER): Payer: Self-pay | Admitting: Family Medicine

## 2019-03-12 ENCOUNTER — Encounter: Payer: Self-pay | Admitting: Family Medicine

## 2019-03-12 DIAGNOSIS — R42 Dizziness and giddiness: Secondary | ICD-10-CM

## 2019-03-12 DIAGNOSIS — M5432 Sciatica, left side: Secondary | ICD-10-CM

## 2019-03-12 DIAGNOSIS — R609 Edema, unspecified: Secondary | ICD-10-CM

## 2019-03-12 DIAGNOSIS — E559 Vitamin D deficiency, unspecified: Secondary | ICD-10-CM

## 2019-03-12 DIAGNOSIS — I10 Essential (primary) hypertension: Secondary | ICD-10-CM

## 2019-03-12 DIAGNOSIS — E785 Hyperlipidemia, unspecified: Secondary | ICD-10-CM

## 2019-03-12 DIAGNOSIS — M5431 Sciatica, right side: Secondary | ICD-10-CM

## 2019-03-12 DIAGNOSIS — Z0000 Encounter for general adult medical examination without abnormal findings: Secondary | ICD-10-CM

## 2019-03-12 MED ORDER — ACETAMINOPHEN 500 MG PO TABS: 500.0000 mg | ORAL_TABLET | Freq: Two times a day (BID) | ORAL | 0 refills | Status: DC

## 2019-03-12 MED ORDER — METOPROLOL SUCCINATE ER 50 MG PO TB24: 50.0000 mg | ORAL_TABLET | Freq: Every day | ORAL | 0 refills | Status: DC

## 2019-03-12 MED ORDER — GABAPENTIN 300 MG PO CAPS: ORAL_CAPSULE | ORAL | 0 refills | Status: DC

## 2019-03-12 MED ORDER — VITAMIN D (ERGOCALCIFEROL) 1.25 MG (50000 UNIT) PO CAPS: 50000.0000 [IU] | ORAL_CAPSULE | ORAL | 0 refills | Status: DC

## 2019-03-12 MED ORDER — FUROSEMIDE 20 MG PO TABS: 20.0000 mg | ORAL_TABLET | Freq: Every day | ORAL | 0 refills | Status: DC

## 2019-03-12 MED ORDER — NITROGLYCERIN 0.4 MG SL SUBL: 0.4000 mg | SUBLINGUAL_TABLET | SUBLINGUAL | 3 refills | Status: DC | PRN

## 2019-03-12 MED ORDER — LISINOPRIL 20 MG PO TABS: 20.0000 mg | ORAL_TABLET | Freq: Every day | ORAL | 0 refills | Status: DC

## 2019-03-12 MED ORDER — MECLIZINE HCL 25 MG PO TABS: 25.0000 mg | ORAL_TABLET | Freq: Three times a day (TID) | ORAL | 0 refills | Status: DC | PRN

## 2019-03-12 MED ORDER — HYDROXYZINE HCL 50 MG PO TABS: 50.0000 mg | ORAL_TABLET | Freq: Three times a day (TID) | ORAL | 0 refills | Status: DC

## 2019-03-12 NOTE — Progress Notes (Addendum)
Virtual Visit via Telephone Note  I connected with Deanna Dudley on 03/12/19 at 10:00 AM EDT by telephone and verified that I am speaking with the correct person using two identifiers.   I discussed the limitations, risks, security and privacy concerns of performing an evaluation and management service by telephone and the availability of in person appointments. I also discussed with the patient that there may be a patient responsible charge related to this service. The patient expressed understanding and agreed to proceed.   History of Present Illness:  Past Medical History:  Diagnosis Date  . CAD (coronary artery disease), native coronary artery    s/p Angioplast only in 2011 @ [place]  . Hyperlipidemia LDL goal <70   . Hypertension   . Muscle spasm   . Myocardial infarction (HCC) 2011  . Syncope   . Tobacco abuse     Current Outpatient Medications on File Prior to Visit  Medication Sig Dispense Refill  . acetaminophen (TYLENOL) 500 MG tablet Take 500 mg by mouth 2 (two) times a day.    Marland Kitchen aspirin EC 81 MG tablet Take 81 mg by mouth daily.    . calcium carbonate (TUMS - DOSED IN MG ELEMENTAL CALCIUM) 500 MG chewable tablet Chew 2 tablets by mouth as needed for indigestion or heartburn.    . furosemide (LASIX) 20 MG tablet Take 1 tablet (20 mg total) by mouth daily. 30 tablet 2  . hydrOXYzine (ATARAX/VISTARIL) 50 MG tablet Take 1 tablet (50 mg total) by mouth 3 (three) times daily. 90 tablet 2  . lisinopril (PRINIVIL,ZESTRIL) 20 MG tablet Take 1 tablet (20 mg total) by mouth daily. 90 tablet 3  . meclizine (ANTIVERT) 25 MG tablet Take 1 tablet (25 mg total) by mouth 3 (three) times daily as needed for dizziness. 90 tablet 1  . naproxen sodium (ALEVE) 220 MG tablet Take 220 mg by mouth 2 (two) times daily as needed (pain).    Marland Kitchen gabapentin (NEURONTIN) 300 MG capsule Take 2 capsules (600 mg total) by mouth 3 (three) times daily. 540 capsule 4  . metoprolol succinate (TOPROL-XL) 50 MG 24  hr tablet TAKE 1 TABLET BY MOUTH ONCE DAILY 90 tablet 1  . nitroGLYCERIN (NITROSTAT) 0.4 MG SL tablet Place 1 tablet (0.4 mg total) under the tongue every 5 (five) minutes as needed for chest pain. (Patient not taking: Reported on 05/01/2018) 25 tablet 3  . Vitamin D, Ergocalciferol, (DRISDOL) 50000 units CAPS capsule Take 1 capsule (50,000 Units total) by mouth every 7 (seven) days. (Patient not taking: Reported on 03/12/2019) 30 capsule 0   No current facility-administered medications on file prior to visit.     Current Status: Since her last office visit, she has been lost to follow up. Today she states that she is doing well with no complaints. 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 She has been out of some of her medications and needs refills. She states that her pain is now managed well with the combination of Gabapentin and Acetaminophen. She denies visual changes, chest pain, cough, shortness of breath, heart palpitations, and falls. She has occasional headaches and dizziness with position changes. Denies severe headaches, confusion, seizures, double vision, and blurred vision, nausea and vomiting.  She denies fevers, chills, fatigue, recent infections, weight loss, and night sweats. No reports of GI problems such as nausea, and constipation. She has no reports of blood in stools, dysuria and hematuria. No depression  or anxiety reported today.  Observations/Objective:  Telephone Virtual Visit   Assessment and Plan:  1. Bilateral sciatica Stable today.   2. Essential hypertension We will refill medications today. She will continue to decrease high sodium intake, excessive alcohol intake, increase potassium intake, smoking cessation, and increase physical activity of at least 30 minutes of cardio activity daily. She will continue to follow Heart Healthy or DASH diet.  3. Dizziness  4. Hyperlipidemia, unspecified hyperlipidemia type  5.  Healthcare maintenance - CBC with Differential; Future - Comprehensive metabolic panel; Future - Lipid Panel; Future - TSH; Future - Vitamin D, 25-hydroxy; Future - Vitamin B12; Future  6. Edema, unspecified type  7. Vitamin D deficiency  Meds ordered this encounter  Medications  . acetaminophen (TYLENOL) 500 MG tablet    Sig: Take 1 tablet (500 mg total) by mouth 2 (two) times a day.    Dispense:  60 tablet    Refill:  0  . furosemide (LASIX) 20 MG tablet    Sig: Take 1 tablet (20 mg total) by mouth daily.    Dispense:  30 tablet    Refill:  0  . hydrOXYzine (ATARAX/VISTARIL) 50 MG tablet    Sig: Take 1 tablet (50 mg total) by mouth 3 (three) times daily.    Dispense:  90 tablet    Refill:  0  . lisinopril (ZESTRIL) 20 MG tablet    Sig: Take 1 tablet (20 mg total) by mouth daily.    Dispense:  30 tablet    Refill:  0  . meclizine (ANTIVERT) 25 MG tablet    Sig: Take 1 tablet (25 mg total) by mouth 3 (three) times daily as needed for dizziness.    Dispense:  90 tablet    Refill:  0  . gabapentin (NEURONTIN) 300 MG capsule    Sig: Take 2 capsules (600 mg= total), 2 times a day.    Dispense:  120 capsule    Refill:  0  . metoprolol succinate (TOPROL-XL) 50 MG 24 hr tablet    Sig: Take 1 tablet (50 mg total) by mouth daily. Take with or immediately following a meal.    Dispense:  30 tablet    Refill:  0  . nitroGLYCERIN (NITROSTAT) 0.4 MG SL tablet    Sig: Place 1 tablet (0.4 mg total) under the tongue every 5 (five) minutes as needed for up to 30 days for chest pain.    Dispense:  100 tablet    Refill:  3  . Vitamin D, Ergocalciferol, (DRISDOL) 1.25 MG (50000 UT) CAPS capsule    Sig: Take 1 capsule (50,000 Units total) by mouth every 7 (seven) days.    Dispense:  5 capsule    Refill:  0    Orders Placed This Encounter  Procedures  . CBC with Differential  . Comprehensive metabolic panel  . Lipid Panel  . TSH  . Vitamin D, 25-hydroxy  . Vitamin B12     Referral Orders  No referral(s) requested today    Raliegh IpNatalie Kyzen Horn,  MSN, FNP-C Patient Care Center [facility] [address]  [place], [place] [address] [phone]    Follow Up Instructions:  She will follow up for office visit in 6 months She will follow up for blood pressure check and labs only on 04/18/2019. Patient counseled on importance of keeping future follow appointments for proper assessment and monitoring labs values. Patient verbalized understanding.     I discussed the assessment and  treatment plan with the patient. The patient was provided an opportunity to ask questions and all were answered. The patient agreed with the plan and demonstrated an understanding of the instructions.   The patient was advised to call back or seek an in-person evaluation if the symptoms worsen or if the condition fails to improve as anticipated.  I provided 20 minutes of non-face-to-face time during this encounter.   Kallie Locks, FNP

## 2019-03-13 DIAGNOSIS — M5432 Sciatica, left side: Secondary | ICD-10-CM | POA: Insufficient documentation

## 2019-03-13 DIAGNOSIS — M5431 Sciatica, right side: Secondary | ICD-10-CM | POA: Insufficient documentation

## 2019-03-13 DIAGNOSIS — R42 Dizziness and giddiness: Secondary | ICD-10-CM | POA: Insufficient documentation

## 2019-03-19 NOTE — Telephone Encounter (Signed)
Message not needed. °

## 2019-04-02 ENCOUNTER — Telehealth: Payer: Self-pay

## 2019-04-02 ENCOUNTER — Ambulatory Visit: Payer: Self-pay | Admitting: Family Medicine

## 2019-04-02 ENCOUNTER — Other Ambulatory Visit: Payer: Self-pay

## 2019-04-02 DIAGNOSIS — Z0000 Encounter for general adult medical examination without abnormal findings: Secondary | ICD-10-CM

## 2019-04-02 NOTE — Telephone Encounter (Signed)
Patient came in this morning for labs and blood pressure check. Patient states that we are suppose send all her medication for 6 months. [facility]

## 2019-04-03 ENCOUNTER — Other Ambulatory Visit: Payer: Self-pay | Admitting: Family Medicine

## 2019-04-03 ENCOUNTER — Encounter: Payer: Self-pay | Admitting: Family Medicine

## 2019-04-03 DIAGNOSIS — M5431 Sciatica, right side: Secondary | ICD-10-CM

## 2019-04-03 DIAGNOSIS — E559 Vitamin D deficiency, unspecified: Secondary | ICD-10-CM

## 2019-04-03 DIAGNOSIS — I10 Essential (primary) hypertension: Secondary | ICD-10-CM

## 2019-04-03 DIAGNOSIS — R42 Dizziness and giddiness: Secondary | ICD-10-CM

## 2019-04-03 DIAGNOSIS — R609 Edema, unspecified: Secondary | ICD-10-CM

## 2019-04-03 DIAGNOSIS — M5432 Sciatica, left side: Secondary | ICD-10-CM

## 2019-04-03 LAB — CBC WITH DIFFERENTIAL/PLATELET
Basophils Absolute: 0.1 10*3/uL (ref 0.0–0.2)
Basos: 1 %
EOS (ABSOLUTE): 0.4 10*3/uL (ref 0.0–0.4)
Eos: 5 %
Hematocrit: 41.2 % (ref 34.0–46.6)
Hemoglobin: 14.6 g/dL (ref 11.1–15.9)
Immature Grans (Abs): 0 10*3/uL (ref 0.0–0.1)
Immature Granulocytes: 0 %
Lymphocytes Absolute: 2.9 10*3/uL (ref 0.7–3.1)
Lymphs: 34 %
MCH: 31.9 pg (ref 26.6–33.0)
MCHC: 35.4 g/dL (ref 31.5–35.7)
MCV: 90 fL (ref 79–97)
Monocytes Absolute: 0.6 10*3/uL (ref 0.1–0.9)
Monocytes: 7 %
Neutrophils Absolute: 4.5 10*3/uL (ref 1.4–7.0)
Neutrophils: 53 %
Platelets: 249 10*3/uL (ref 150–450)
RBC: 4.58 x10E6/uL (ref 3.77–5.28)
RDW: 12.9 % (ref 11.7–15.4)
WBC: 8.5 10*3/uL (ref 3.4–10.8)

## 2019-04-03 LAB — COMPREHENSIVE METABOLIC PANEL
ALT: 18 IU/L (ref 0–32)
AST: 14 IU/L (ref 0–40)
Albumin/Globulin Ratio: 1.7 (ref 1.2–2.2)
Albumin: 4 g/dL (ref 3.8–4.8)
Alkaline Phosphatase: 67 IU/L (ref 39–117)
BUN/Creatinine Ratio: 19 (ref 12–28)
BUN: 16 mg/dL (ref 8–27)
Bilirubin Total: 0.3 mg/dL (ref 0.0–1.2)
CO2: 23 mmol/L (ref 20–29)
Calcium: 9.4 mg/dL (ref 8.7–10.3)
Chloride: 103 mmol/L (ref 96–106)
Creatinine, Ser: 0.84 mg/dL (ref 0.57–1.00)
GFR calc Af Amer: 86 mL/min/{1.73_m2} (ref >59)
GFR calc non Af Amer: 75 mL/min/{1.73_m2} (ref >59)
Globulin, Total: 2.4 g/dL (ref 1.5–4.5)
Glucose: 91 mg/dL (ref 65–99)
Potassium: 4.2 mmol/L (ref 3.5–5.2)
Sodium: 141 mmol/L (ref 134–144)
Total Protein: 6.4 g/dL (ref 6.0–8.5)

## 2019-04-03 LAB — LIPID PANEL
Chol/HDL Ratio: 7.2 ratio — ABNORMAL HIGH (ref 0.0–4.4)
Cholesterol, Total: 210 mg/dL — ABNORMAL HIGH (ref 100–199)
HDL: 29 mg/dL — ABNORMAL LOW (ref >39)
LDL Calculated: 140 mg/dL — ABNORMAL HIGH (ref 0–99)
Triglycerides: 207 mg/dL — ABNORMAL HIGH (ref 0–149)
VLDL Cholesterol Cal: 41 mg/dL — ABNORMAL HIGH (ref 5–40)

## 2019-04-03 LAB — VITAMIN D 25 HYDROXY (VIT D DEFICIENCY, FRACTURES): Vit D, 25-Hydroxy: 13.4 ng/mL — ABNORMAL LOW (ref 30.0–100.0)

## 2019-04-03 LAB — TSH: TSH: 2.63 u[IU]/mL (ref 0.450–4.500)

## 2019-04-03 LAB — VITAMIN B12: Vitamin B-12: 281 pg/mL (ref 232–1245)

## 2019-04-03 MED ORDER — GABAPENTIN 300 MG PO CAPS: ORAL_CAPSULE | ORAL | 6 refills | Status: DC

## 2019-04-03 MED ORDER — HYDROXYZINE HCL 50 MG PO TABS: 50.0000 mg | ORAL_TABLET | Freq: Three times a day (TID) | ORAL | 6 refills | Status: DC

## 2019-04-03 MED ORDER — MECLIZINE HCL 25 MG PO TABS: 25.0000 mg | ORAL_TABLET | Freq: Three times a day (TID) | ORAL | 6 refills | Status: DC | PRN

## 2019-04-03 MED ORDER — LISINOPRIL 20 MG PO TABS: 20.0000 mg | ORAL_TABLET | Freq: Every day | ORAL | 6 refills | Status: DC

## 2019-04-03 MED ORDER — METOPROLOL SUCCINATE ER 50 MG PO TB24: 50.0000 mg | ORAL_TABLET | Freq: Every day | ORAL | 6 refills | Status: DC

## 2019-04-03 MED ORDER — VITAMIN D (ERGOCALCIFEROL) 1.25 MG (50000 UNIT) PO CAPS: 50000.0000 [IU] | ORAL_CAPSULE | ORAL | 6 refills | Status: DC

## 2019-04-03 MED ORDER — FUROSEMIDE 20 MG PO TABS: 20.0000 mg | ORAL_TABLET | Freq: Every day | ORAL | 6 refills | Status: DC

## 2019-04-04 NOTE — Telephone Encounter (Signed)
Patient notified

## 2019-04-04 NOTE — Telephone Encounter (Signed)
Left a vm for patient to callback 

## 2019-05-28 ENCOUNTER — Telehealth: Payer: Self-pay | Admitting: Family

## 2019-05-28 ENCOUNTER — Telehealth: Payer: Self-pay

## 2019-05-28 DIAGNOSIS — Z20828 Contact with and (suspected) exposure to other viral communicable diseases: Secondary | ICD-10-CM

## 2019-05-28 DIAGNOSIS — Z20822 Contact with and (suspected) exposure to covid-19: Secondary | ICD-10-CM

## 2019-05-28 MED ORDER — BENZONATATE 100 MG PO CAPS: 100.0000 mg | ORAL_CAPSULE | Freq: Three times a day (TID) | ORAL | 0 refills | Status: DC | PRN

## 2019-05-28 NOTE — Progress Notes (Signed)
E-Visit for [employer]   Your current symptoms could be consistent with the coronavirus.  I have sent your note to our Community Testing site. They should reach out to you in the next 24 hours to set up a time for testing if you qualify.   Approximately 5 minutes was spent documenting and reviewing patient's chart.    COVID-19 is a respiratory illness with symptoms that are similar to the flu. Symptoms are typically mild to moderate, but there have been cases of severe illness and death due to the virus. The following symptoms may appear 2-14 days after exposure: . Fever . Cough . Shortness of breath or difficulty breathing . Chills . Repeated shaking with chills . Muscle pain . Headache . Sore throat . New loss of taste or smell . Fatigue . Congestion or runny nose . Nausea or vomiting . Diarrhea  It is vitally important that if you feel that you have an infection such as this virus or any other virus that you stay home and away from places where you may spread it to others.  You should self-quarantine for 14 days if you have symptoms that could potentially be coronavirus or have been in close contact a with a person diagnosed with COVID-19 within the last 2 weeks. You should avoid contact with people age 36 and older.   You should wear a mask or cloth face covering over your nose and mouth if you must be around other people or animals, including pets (even at home). Try to stay at least 6 feet away from other people. This will protect the people around you.  You can use medication such as A prescription cough medication called Tessalon Perles 100 mg. You may take 1-2 capsules every 8 hours as needed for cough  You may also take acetaminophen (Tylenol) as needed for fever.   Reduce your risk of any infection by using the same precautions used for avoiding the common cold or flu:  Marland Kitchen Wash your hands often with soap and warm water for at least 20 seconds.  If soap and water are  not readily available, use an alcohol-based hand sanitizer with at least 60% alcohol.  . If coughing or sneezing, cover your mouth and nose by coughing or sneezing into the elbow areas of your shirt or coat, into a tissue or into your sleeve (not your hands). . Avoid shaking hands with others and consider head nods or verbal greetings only. . Avoid touching your eyes, nose, or mouth with unwashed hands.  . Avoid close contact with people who are sick. . Avoid places or events with large numbers of people in one location, like concerts or sporting events. . Carefully consider travel plans you have or are making. . If you are planning any travel outside or inside the [place], visit the CDC's Travelers' Health webpage for the latest health notices. . If you have some symptoms but not all symptoms, continue to monitor at home and seek medical attention if your symptoms worsen. . If you are having a medical emergency, call 911.  HOME CARE . Only take medications as instructed by your medical team. . Drink plenty of fluids and get plenty of rest. . A steam or ultrasonic humidifier can help if you have congestion.   GET HELP RIGHT AWAY IF YOU HAVE EMERGENCY WARNING SIGNS** FOR COVID-19. If you or someone is showing any of these signs seek emergency medical care immediately. Call 911 or proceed to your closest  emergency facility if: . You develop worsening high fever. . Trouble breathing . Bluish lips or face . Persistent pain or pressure in the chest . New confusion . Inability to wake or stay awake . You cough up blood. . Your symptoms become more severe  **This list is not all possible symptoms. Contact your medical provider for any symptoms that are sever or concerning to you.   MAKE SURE YOU   Understand these instructions.  Will watch your condition.  Will get help right away if you are not doing well or get worse.  Your e-visit answers were reviewed by a board certified advanced  clinical practitioner to complete your personal care plan.  Depending on the condition, your plan could have included both over the counter or prescription medications.  If there is a problem please reply once you have received a response from your provider.  Your safety is important to [place].  If you have drug allergies check your prescription carefully.    You can use MyChart to ask questions about today's visit, request a non-urgent call back, or ask for a work or school excuse for 24 hours related to this e-Visit. If it has been greater than 24 hours you will need to follow up with your provider, or enter a new e-Visit to address those concerns. You will get an e-mail in the next two days asking about your experience.  I hope that your e-visit has been valuable and will speed your recovery. Thank you for using e-visits.

## 2019-05-29 NOTE — Telephone Encounter (Signed)
Left a vm for patient to callback 

## 2019-05-29 NOTE — Telephone Encounter (Signed)
Patient advise that we have not received lab results

## 2019-09-11 ENCOUNTER — Ambulatory Visit: Payer: Self-pay | Admitting: Family Medicine

## 2019-09-12 ENCOUNTER — Ambulatory Visit: Payer: Self-pay | Admitting: Family Medicine

## 2019-09-24 ENCOUNTER — Ambulatory Visit (INDEPENDENT_AMBULATORY_CARE_PROVIDER_SITE_OTHER): Payer: 59 | Admitting: Family Medicine

## 2019-09-24 ENCOUNTER — Other Ambulatory Visit: Payer: Self-pay

## 2019-09-24 ENCOUNTER — Encounter: Payer: Self-pay | Admitting: Family Medicine

## 2019-09-24 VITALS — BP 146/77 | HR 69 | Temp 98.2°F | Ht 65.0 in | Wt 252.6 lb

## 2019-09-24 DIAGNOSIS — R609 Edema, unspecified: Secondary | ICD-10-CM

## 2019-09-24 DIAGNOSIS — N390 Urinary tract infection, site not specified: Secondary | ICD-10-CM | POA: Diagnosis not present

## 2019-09-24 DIAGNOSIS — R519 Headache, unspecified: Secondary | ICD-10-CM | POA: Diagnosis not present

## 2019-09-24 DIAGNOSIS — Z09 Encounter for follow-up examination after completed treatment for conditions other than malignant neoplasm: Secondary | ICD-10-CM

## 2019-09-24 DIAGNOSIS — R8290 Unspecified abnormal findings in urine: Secondary | ICD-10-CM

## 2019-09-24 DIAGNOSIS — M5431 Sciatica, right side: Secondary | ICD-10-CM | POA: Diagnosis not present

## 2019-09-24 DIAGNOSIS — I10 Essential (primary) hypertension: Secondary | ICD-10-CM

## 2019-09-24 DIAGNOSIS — R42 Dizziness and giddiness: Secondary | ICD-10-CM

## 2019-09-24 DIAGNOSIS — M5432 Sciatica, left side: Secondary | ICD-10-CM

## 2019-09-24 LAB — POCT URINALYSIS DIPSTICK
Bilirubin, UA: NEGATIVE
Blood, UA: NEGATIVE
Glucose, UA: NEGATIVE
Ketones, UA: NEGATIVE
Nitrite, UA: NEGATIVE
Protein, UA: NEGATIVE
Spec Grav, UA: 1.02 (ref 1.010–1.025)
Urobilinogen, UA: 0.2 E.U./dL
pH, UA: 5.5 (ref 5.0–8.0)

## 2019-09-24 MED ORDER — ASPIRIN EC 81 MG PO TBEC: 81.0000 mg | DELAYED_RELEASE_TABLET | Freq: Every day | ORAL | 6 refills | Status: DC

## 2019-09-24 MED ORDER — SULFAMETHOXAZOLE-TRIMETHOPRIM 800-160 MG PO TABS: 1.0000 | ORAL_TABLET | Freq: Two times a day (BID) | ORAL | 0 refills | Status: DC

## 2019-09-24 MED ORDER — HYDROXYZINE HCL 50 MG PO TABS: 50.0000 mg | ORAL_TABLET | Freq: Three times a day (TID) | ORAL | 6 refills | Status: DC

## 2019-09-24 MED ORDER — LISINOPRIL 20 MG PO TABS: 20.0000 mg | ORAL_TABLET | Freq: Every day | ORAL | 6 refills | Status: DC

## 2019-09-24 MED ORDER — FUROSEMIDE 20 MG PO TABS: 20.0000 mg | ORAL_TABLET | Freq: Every day | ORAL | 6 refills | Status: DC

## 2019-09-24 MED ORDER — METOPROLOL SUCCINATE ER 50 MG PO TB24: 50.0000 mg | ORAL_TABLET | Freq: Every day | ORAL | 6 refills | Status: DC

## 2019-09-24 MED ORDER — GABAPENTIN 300 MG PO CAPS: ORAL_CAPSULE | ORAL | 6 refills | Status: DC

## 2019-09-24 MED ORDER — MECLIZINE HCL 25 MG PO TABS: 25.0000 mg | ORAL_TABLET | Freq: Three times a day (TID) | ORAL | 6 refills | Status: DC | PRN

## 2019-09-24 NOTE — Patient Instructions (Signed)
Urinary Tract Infection, Adult A urinary tract infection (UTI) is an infection of any part of the urinary tract. The urinary tract includes:  The kidneys.  The ureters.  The bladder.  The urethra. These organs make, store, and get rid of pee (urine) in the body. What are the causes? This is caused by germs (bacteria) in your genital area. These germs grow and cause swelling (inflammation) of your urinary tract. What increases the risk? You are more likely to develop this condition if:  You have a small, thin tube (catheter) to drain pee.  You cannot control when you pee or poop (incontinence).  You are female, and: ? You use these methods to prevent pregnancy: ? A medicine that kills sperm (spermicide). ? A device that blocks sperm (diaphragm). ? You have low levels of a female hormone (estrogen). ? You are pregnant.  You have genes that add to your risk.  You are sexually active.  You take antibiotic medicines.  You have trouble peeing because of: ? A prostate that is bigger than normal, if you are female. ? A blockage in the part of your body that drains pee from the bladder (urethra). ? A kidney stone. ? A nerve condition that affects your bladder (neurogenic bladder). ? Not getting enough to drink. ? Not peeing often enough.  You have other conditions, such as: ? Diabetes. ? A weak disease-fighting system (immune system). ? Sickle cell disease. ? Gout. ? Injury of the spine. What are the signs or symptoms? Symptoms of this condition include:  Needing to pee right away (urgently).  Peeing often.  Peeing small amounts often.  Pain or burning when peeing.  Blood in the pee.  Pee that smells bad or not like normal.  Trouble peeing.  Pee that is cloudy.  Fluid coming from the vagina, if you are female.  Pain in the belly or lower back. Other symptoms include:  Throwing up (vomiting).  No urge to eat.  Feeling mixed up (confused).  Being tired  and grouchy (irritable).  A fever.  Watery poop (diarrhea). How is this treated? This condition may be treated with:  Antibiotic medicine.  Other medicines.  Drinking enough water. Follow these instructions at home:  Medicines  Take over-the-counter and prescription medicines only as told by your doctor.  If you were prescribed an antibiotic medicine, take it as told by your doctor. Do not stop taking it even if you start to feel better. General instructions  Make sure you: ? Pee until your bladder is empty. ? Do not hold pee for a long time. ? Empty your bladder after sex. ? Wipe from front to back after pooping if you are a female. Use each tissue one time when you wipe.  Drink enough fluid to keep your pee pale yellow.  Keep all follow-up visits as told by your doctor. This is important. Contact a doctor if:  You do not get better after 1-2 days.  Your symptoms go away and then come back. Get help right away if:  You have very bad back pain.  You have very bad pain in your lower belly.  You have a fever.  You are sick to your stomach (nauseous).  You are throwing up. Summary  A urinary tract infection (UTI) is an infection of any part of the urinary tract.  This condition is caused by germs in your genital area.  There are many risk factors for a UTI. These include having a small, thin   tube to drain pee and not being able to control when you pee or poop.  Treatment includes antibiotic medicines for germs.  Drink enough fluid to keep your pee pale yellow. This information is not intended to replace advice given to you by your health care provider. Make sure you discuss any questions you have with your health care provider. Document Released: 04/24/2008 Document Revised: 10/24/2018 Document Reviewed: 05/16/2018 Elsevier Patient Education  2020 Elsevier Inc. Sulfamethoxazole; Trimethoprim, SMX-TMP tablets What is this medicine? SULFAMETHOXAZOLE;  TRIMETHOPRIM or SMX-TMP (suhl fuh meth OK suh zohl; trye METH oh prim) is a combination of a sulfonamide antibiotic and a second antibiotic, trimethoprim. It is used to treat or prevent certain kinds of bacterial infections. It will not work for colds, flu, or other viral infections. This medicine may be used for other purposes; ask your health care provider or pharmacist if you have questions. COMMON BRAND NAME(S): Bacter-Aid DS, Bactrim, Bactrim DS, Septra, Septra DS What should I tell my health care provider before I take this medicine? They need to know if you have any of these conditions:  anemia  asthma  being treated with anticonvulsants  if you frequently drink alcohol containing drinks  kidney disease  liver disease  low level of folic acid or glucose-6-phosphate dehydrogenase  poor nutrition or malabsorption  porphyria  severe allergies  thyroid disorder  an unusual or allergic reaction to sulfamethoxazole, trimethoprim, sulfa drugs, other medicines, foods, dyes, or preservatives  pregnant or trying to get pregnant  breast-feeding How should I use this medicine? Take this medicine by mouth with a full glass of water. Follow the directions on the prescription label. Take your medicine at regular intervals. Do not take it more often than directed. Do not skip doses or stop your medicine early. Talk to your pediatrician regarding the use of this medicine in children. Special care may be needed. This medicine has been used in children as young as 2 months of age. Overdosage: If you think you have taken too much of this medicine contact a poison control center or emergency room at once. NOTE: This medicine is only for you. Do not share this medicine with others. What if I miss a dose? If you miss a dose, take it as soon as you can. If it is almost time for your next dose, take only that dose. Do not take double or extra doses. What may interact with this medicine? Do not  take this medicine with any of the following medications:  aminobenzoate potassium  dofetilide  metronidazole This medicine may also interact with the following medications:  ACE inhibitors like benazepril, enalapril, lisinopril, and ramipril  birth control pills  cyclosporine  digoxin  diuretics  indomethacin  medicines for diabetes  methenamine  methotrexate  phenytoin  potassium supplements  pyrimethamine  sulfinpyrazone  tricyclic antidepressants  warfarin This list may not describe all possible interactions. Give your health care provider a list of all the medicines, herbs, non-prescription drugs, or dietary supplements you use. Also tell them if you smoke, drink alcohol, or use illegal drugs. Some items may interact with your medicine. What should I watch for while using this medicine? Tell your doctor or health care professional if your symptoms do not improve. Drink several glasses of water a day to reduce the risk of kidney problems. Do not treat diarrhea with over the counter products. Contact your doctor if you have diarrhea that lasts more than 2 days or if it is severe and watery.   This medicine can make you more sensitive to the sun. Keep out of the sun. If you cannot avoid being in the sun, wear protective clothing and use a sunscreen. Do not use sun lamps or tanning beds/booths. What side effects may I notice from receiving this medicine? Side effects that you should report to your doctor or health care professional as soon as possible:  allergic reactions like skin rash or hives, swelling of the face, lips, or tongue  breathing problems  fever or chills, sore throat  irregular heartbeat, chest pain  joint or muscle pain  pain or difficulty passing urine  red pinpoint spots on skin  redness, blistering, peeling or loosening of the skin, including inside the mouth  unusual bleeding or bruising  unusually weak or tired  yellowing of the  eyes or skin Side effects that usually do not require medical attention (report to your doctor or health care professional if they continue or are bothersome):  diarrhea  dizziness  headache  loss of appetite  nausea, vomiting  nervousness This list may not describe all possible side effects. Call your doctor for medical advice about side effects. You may report side effects to FDA at 1-800-FDA-1088. Where should I keep my medicine? Keep out of the reach of children. Store at room temperature between 20 to 25 degrees C (68 to 77 degrees F). Protect from light. Throw away any unused medicine after the expiration date. NOTE: This sheet is a summary. It may not cover all possible information. If you have questions about this medicine, talk to your doctor, pharmacist, or health care provider.  2020 Elsevier/Gold Standard (2013-06-13 14:38:26)  

## 2019-09-24 NOTE — Progress Notes (Signed)
[facility]    Established Patient Office Visit  Subjective:  Patient ID: Deanna Dudley, female    DOB: 1956-05-16  Age: 63 y.o. MRN: [id number]  CC:  Chief Complaint  Patient presents with  . Follow-up    6 month follow up, medication management  . Anxiety    work stress    HPI Deanna Dudley is a 63 year old female who presents for Follow Up today.   Past Medical History:  Diagnosis Date  . CAD (coronary artery disease), native coronary artery    s/p Angioplast only in 2011 @ [place]  . Hyperlipidemia LDL goal <70   . Hypertension   . Muscle spasm   . Myocardial infarction (HCC) 2011  . Syncope   . Tobacco abuse   . Vitamin A deficiency    Current Status: Since her last office visit, she is doing well with no complaints. She states that she has been having increased episodes of headaches, which she r/t to stress. She denies suicidal ideations, homicidal ideations, or auditory hallucinations. She is taking Acetaminophen and Motrin for relief of her symptoms.  She denies visual changes, chest pain, cough, shortness of breath, heart palpitations, and falls. She has occasional headaches and dizziness with position changes. Denies severe headaches, confusion, seizures, double vision, and blurred vision, nausea and vomiting. She denies fevers, chills, fatigue, recent infections, weight loss, and night sweats.  No reports of GI problems such as nausea, vomiting, diarrhea, and constipation. She has no reports of blood in stools, dysuria and hematuria. She denies pain today.   Past Surgical History:  Procedure Laterality Date  . ABDOMINAL HYSTERECTOMY  1983  . APPENDECTOMY     Age 48  . BREAST SURGERY Bilateral    Removed ducts from both breast  . CHOLECYSTECTOMY  1995  . RIGHT/LEFT HEART CATH AND CORONARY ANGIOGRAPHY N/A 02/06/2018   Procedure: RIGHT/LEFT HEART CATH AND CORONARY ANGIOGRAPHY;  Surgeon: Corky Crafts, MD;   Location[facility] INVASIVE CV LAB;  Service: Cardiovascular;  Laterality: N/A;  . SHOULDER SURGERY Right    Rotater cuff repair  . TONSILLECTOMY     Age 47    Family History  Problem Relation Age of Onset  . Stroke Mother   . Colon cancer Mother   . Lung cancer Mother   . Hypothyroidism Father   . Diabetes Father   . CAD Father   . Heart disease Maternal Grandfather   . Clotting disorder Maternal Grandfather   . Heart disease Paternal Grandmother     Social History   Socioeconomic History  . Marital status: Married    Spouse name: Not on file  . Number of children: Not on file  . Years of education: Not on file  . Highest education level: Not on file  Occupational History  . Not on file  Social Needs  . Financial resource strain: Not on file  . Food insecurity    Worry: Not on file    Inability: Not on file  . Transportation needs    Medical: Not on file    Non-medical: Not on file  Tobacco Use  . Smoking status: Never Smoker  . Smokeless tobacco: Never Used  Substance and Sexual Activity  . Alcohol use: Yes  . Drug use: No  . Sexual activity: Not on file  Lifestyle  . Physical activity    Days per week: Not on file    Minutes per session: Not on  file  . Stress: Not on file  Relationships  . Social Musicianconnections    Talks on phone: Not on file    Gets together: Not on file    Attends religious service: Not on file    Active member of club or organization: Not on file    Attends meetings of clubs or organizations: Not on file    Relationship status: Not on file  . Intimate partner violence    Fear of current or ex partner: Not on file    Emotionally abused: Not on file    Physically abused: Not on file    Forced sexual activity: Not on file  Other Topics Concern  . Not on file  Social History Narrative  . Not on file    Outpatient Medications Prior to Visit  Medication Sig Dispense Refill  . acetaminophen (TYLENOL) 500 MG tablet Take 1 tablet (500 mg  total) by mouth 2 (two) times a day. 60 tablet 0  . calcium carbonate (TUMS - DOSED IN MG ELEMENTAL CALCIUM) 500 MG chewable tablet Chew 2 tablets by mouth as needed for indigestion or heartburn.    . naproxen sodium (ALEVE) 220 MG tablet Take 220 mg by mouth 2 (two) times daily as needed (pain).    Marland Kitchen. aspirin EC 81 MG tablet Take 81 mg by mouth daily.    . furosemide (LASIX) 20 MG tablet Take 1 tablet (20 mg total) by mouth daily. 30 tablet 6  . hydrOXYzine (ATARAX/VISTARIL) 50 MG tablet Take 1 tablet (50 mg total) by mouth 3 (three) times daily. 90 tablet 6  . lisinopril (ZESTRIL) 20 MG tablet Take 1 tablet (20 mg total) by mouth daily. 30 tablet 6  . meclizine (ANTIVERT) 25 MG tablet Take 1 tablet (25 mg total) by mouth 3 (three) times daily as needed for dizziness. 90 tablet 6  . metoprolol succinate (TOPROL-XL) 50 MG 24 hr tablet Take 1 tablet (50 mg total) by mouth daily. Take with or immediately following a meal. 30 tablet 6  . nitroGLYCERIN (NITROSTAT) 0.4 MG SL tablet Place 1 tablet (0.4 mg total) under the tongue every 5 (five) minutes as needed for up to 30 days for chest pain. (Patient not taking: Reported on 09/24/2019) 100 tablet 3  . Vitamin D, Ergocalciferol, (DRISDOL) 1.25 MG (50000 UT) CAPS capsule Take 1 capsule (50,000 Units total) by mouth every 7 (seven) days. (Patient not taking: Reported on 09/24/2019) 5 capsule 6  . benzonatate (TESSALON PERLES) 100 MG capsule Take 1 capsule (100 mg total) by mouth 3 (three) times daily as needed. 20 capsule 0  . gabapentin (NEURONTIN) 300 MG capsule Take 2 capsules (600 mg= total), 2 times a day. 120 capsule 6   No facility-administered medications prior to visit.     Allergies  Allergen Reactions  . Morphine And Related Hives and Shortness Of Breath  . Statins Other (See Comments)    Muscle cramps  . Xanax [Alprazolam]     Over sedated, possible sleep walking   . Demerol [Meperidine]     Migraines   . Other     [place] Nuts - tongue  swelling, mouth itching     ROS Review of Systems  Constitutional: Negative.   HENT: Negative.   Eyes: Negative.   Respiratory: Negative.   Cardiovascular: Negative.   Gastrointestinal: Positive for abdominal distention.  Endocrine: Negative.   Genitourinary: Negative.   Musculoskeletal: Positive for arthralgias (generalized joint pain).  Skin: Negative.   Allergic/Immunologic: Negative.  Neurological: Positive for dizziness and headaches.  Hematological: Negative.   Psychiatric/Behavioral: Negative.       Objective:    Physical Exam  Constitutional: She is oriented to person, place, and time. She appears well-developed and well-nourished.  HENT:  Head: Normocephalic.  Eyes: Conjunctivae are normal.  Neck: Normal range of motion. Neck supple.  Cardiovascular: Normal rate, regular rhythm, normal heart sounds and intact distal pulses.  Pulmonary/Chest: Effort normal and breath sounds normal.  Abdominal: Soft. Bowel sounds are normal. She exhibits distension (obese).  Musculoskeletal: Normal range of motion.  Neurological: She is alert and oriented to person, place, and time. She has normal reflexes.  Skin: Skin is warm and dry.  Psychiatric: She has a normal mood and affect. Her behavior is normal. Judgment and thought content normal.  Nursing note and vitals reviewed.   BP (!) 146/77   Pulse 69   Temp 98.2 F (36.8 C) (Oral)   Ht  (1.651 m)   Wt 252 lb 9.6 oz (114.6 kg)   SpO2 99%   BMI 42.03 kg/m  Wt Readings from Last 3 Encounters:  09/24/19 252 lb 9.6 oz (114.6 kg)  04/02/19 257 lb (116.6 kg)  06/05/18 257 lb (116.6 kg)     Health Maintenance Due  Topic Date Due  . Hepatitis C Screening  1956-02-19  . HIV Screening  10/25/1971  . TETANUS/TDAP  10/25/1975  . PAP SMEAR-Modifier  10/24/1977  . MAMMOGRAM  10/24/2006  . COLONOSCOPY  10/24/2006  . INFLUENZA VACCINE  06/21/2019    There are no preventive care reminders to display for this patient.   Lab Results  Component Value Date   TSH 2.630 04/02/2019   Lab Results  Component Value Date   WBC 8.5 04/02/2019   HGB 14.6 04/02/2019   HCT 41.2 04/02/2019   MCV 90 04/02/2019   PLT 249 04/02/2019   Lab Results  Component Value Date   NA 141 04/02/2019   K 4.2 04/02/2019   CO2 23 04/02/2019   GLUCOSE 91 04/02/2019   BUN 16 04/02/2019   CREATININE 0.84 04/02/2019   BILITOT 0.3 04/02/2019   ALKPHOS 67 04/02/2019   AST 14 04/02/2019   ALT 18 04/02/2019   PROT 6.4 04/02/2019   ALBUMIN 4.0 04/02/2019   CALCIUM 9.4 04/02/2019   ANIONGAP 11 05/29/2018   Lab Results  Component Value Date   CHOL 210 (H) 04/02/2019   Lab Results  Component Value Date   HDL 29 (L) 04/02/2019   Lab Results  Component Value Date   LDLCALC 140 (H) 04/02/2019   Lab Results  Component Value Date   TRIG 207 (H) 04/02/2019   Lab Results  Component Value Date   CHOLHDL 7.2 (H) 04/02/2019   Lab Results  Component Value Date   HGBA1C 5.5 12/18/2017      Assessment & Plan:   1. Essential hypertension The current medical regimen is effective; blood pressue  is stable at 146/77 today; continue present plan and medications as prescribed. She will continue to take medications as prescribed, to decrease high sodium intake, excessive alcohol intake, increase potassium intake, smoking cessation, and increase physical activity of at least 30 minutes of cardio activity daily. She will continue to follow Heart Healthy or DASH diet. - POCT urinalysis dipstick  2. Bilateral sciatica Stable today.  - gabapentin (NEURONTIN) 300 MG capsule; Take 2 capsules (600 mg= total), 3 times a day.  Dispense: 180 capsule; Refill: 6  3. Nonintractable headache, unspecified  chronicity pattern, unspecified headache type Stable today. She will continue OTC pain medication as needed.   4. Urinary tract infection without hematuria, site unspecified We will initiate Bactrim today.  - sulfamethoxazole-trimethoprim  (BACTRIM DS) 800-160 MG tablet; Take 1 tablet by mouth 2 (two) times daily.  Dispense: 14 tablet; Refill: 0  5. Abnormal urinalysis Results are pending.  - Urine Culture  6. Follow up She will follow up in 6 months.   Meds ordered this encounter  Medications  . sulfamethoxazole-trimethoprim (BACTRIM DS) 800-160 MG tablet    Sig: Take 1 tablet by mouth 2 (two) times daily.    Dispense:  14 tablet    Refill:  0  . gabapentin (NEURONTIN) 300 MG capsule    Sig: Take 2 capsules (600 mg= total), 3 times a day.    Dispense:  180 capsule    Refill:  6    Orders Placed This Encounter  Procedures  . Urine Culture  . POCT urinalysis dipstick    Referral Orders  No referral(s) requested today    Kathe Becton,  MSN, FNP-BC [address] [place], [place] [address] [phone]- fax  Problem List Items Addressed This Visit      Cardiovascular and Mediastinum   Hypertension - Primary   Relevant Orders   POCT urinalysis dipstick (Completed)     Nervous and Auditory   Bilateral sciatica   Relevant Medications   gabapentin (NEURONTIN) 300 MG capsule    Other Visit Diagnoses    Nonintractable headache, unspecified chronicity pattern, unspecified headache type       Relevant Medications   gabapentin (NEURONTIN) 300 MG capsule   Urinary tract infection without hematuria, site unspecified       Relevant Medications   sulfamethoxazole-trimethoprim (BACTRIM DS) 800-160 MG tablet   Abnormal urinalysis       Relevant Orders   Urine Culture   Follow up          Meds ordered this encounter  Medications  . sulfamethoxazole-trimethoprim (BACTRIM DS) 800-160 MG tablet    Sig: Take 1 tablet by mouth 2 (two) times daily.    Dispense:  14 tablet    Refill:  0  . gabapentin (NEURONTIN) 300 MG capsule    Sig: Take 2 capsules (600 mg= total), 3 times a day.    Dispense:  180 capsule     Refill:  6    Follow-up: Return in about 6 months (around 03/23/2020).    Azzie Glatter, FNP

## 2019-09-26 LAB — URINE CULTURE: Organism ID, Bacteria: NO GROWTH

## 2019-11-07 ENCOUNTER — Telehealth: Payer: Self-pay | Admitting: Family Medicine

## 2019-11-07 ENCOUNTER — Telehealth: Payer: 59 | Admitting: Physician Assistant

## 2019-11-07 DIAGNOSIS — N390 Urinary tract infection, site not specified: Secondary | ICD-10-CM

## 2019-11-07 MED ORDER — CEPHALEXIN 500 MG PO CAPS: ORAL_CAPSULE | ORAL | 0 refills | Status: DC

## 2019-11-07 NOTE — Progress Notes (Signed)
We are sorry that you are not feeling well.  Based on what you shared with me it looks like you most likely have a simple urinary tract infection.  Thank you for including the information in your questionnaire including your over-the-counter dipstick.  I reviewed your chart and your 2 previous urine cultures have not grown any specific bacteria.  In our region, Keflex has a better sensitivity for the most common types of bacteria that cause urinary tract infections then other medications like Cipro.  Keflex is generally safe and very effective.  Bactrim can cause other kinds of side effects and should not be combined with Macrodantin.  I will be happy to write you a prescription for Keflex as I think this will quickly resolve your problem.  If it does not, please plan a face-to-face visit with your primary care to ensure there is not another reason for your symptoms.  A UTI (Urinary Tract Infection) is a bacterial infection of the bladder.  Most cases of urinary tract infections are simple to treat but a key part of your care is to encourage you to drink plenty of fluids and watch your symptoms carefully.  I have prescribed Keflex 500 mg twice a day for 7 days.  Your symptoms should gradually improve. Call us if the burning in your urine worsens, you develop worsening fever, back pain or pelvic pain or if your symptoms do not resolve after completing the antibiotic.  Urinary tract infections can be prevented by drinking plenty of water to keep your body hydrated.  Also be sure when you wipe, wipe from front to back and don't hold it in!  If possible, empty your bladder every 4 hours.  Your e-visit answers were reviewed by a board certified advanced clinical practitioner to complete your personal care plan.  Depending on the condition, your plan could have included both over the counter or prescription medications.  If there is a problem please reply  once you have received a response from your  provider.  Your safety is important to [place].  If you have drug allergies check your prescription carefully.    You can use MyChart to ask questions about today's visit, request a non-urgent call back, or ask for a work or school excuse for 24 hours related to this e-Visit. If it has been greater than 24 hours you will need to follow up with your provider, or enter a new e-Visit to address those concerns.   You will get an e-mail in the next two days asking about your experience.  I hope that your e-visit has been valuable and will speed your recovery. Thank you for using e-visits.  Greater than 5 minutes, yet less than 10 minutes of time have been spent researching, coordinating, and implementing care for this patient today

## 2019-11-07 NOTE — Telephone Encounter (Signed)
Patient started having flank pain. She did a tele-visit with a [facility] antibiotic was changed to Keflex.

## 2019-11-15 ENCOUNTER — Other Ambulatory Visit: Payer: Self-pay | Admitting: Family Medicine

## 2019-11-15 DIAGNOSIS — I10 Essential (primary) hypertension: Secondary | ICD-10-CM

## 2019-11-15 DIAGNOSIS — M5431 Sciatica, right side: Secondary | ICD-10-CM

## 2019-11-17 ENCOUNTER — Other Ambulatory Visit: Payer: Self-pay

## 2019-11-17 ENCOUNTER — Telehealth: Payer: Self-pay | Admitting: Family Medicine

## 2019-11-17 DIAGNOSIS — R42 Dizziness and giddiness: Secondary | ICD-10-CM

## 2019-11-17 DIAGNOSIS — R609 Edema, unspecified: Secondary | ICD-10-CM

## 2019-11-17 DIAGNOSIS — N390 Urinary tract infection, site not specified: Secondary | ICD-10-CM

## 2019-11-17 DIAGNOSIS — M5431 Sciatica, right side: Secondary | ICD-10-CM

## 2019-11-17 DIAGNOSIS — I10 Essential (primary) hypertension: Secondary | ICD-10-CM

## 2019-11-17 MED ORDER — METOPROLOL SUCCINATE ER 50 MG PO TB24: 50.0000 mg | ORAL_TABLET | Freq: Every day | ORAL | 6 refills | Status: DC

## 2019-11-17 MED ORDER — LISINOPRIL 20 MG PO TABS: 20.0000 mg | ORAL_TABLET | Freq: Every day | ORAL | 6 refills | Status: DC

## 2019-11-17 MED ORDER — SULFAMETHOXAZOLE-TRIMETHOPRIM 800-160 MG PO TABS: 1.0000 | ORAL_TABLET | Freq: Two times a day (BID) | ORAL | 0 refills | Status: DC

## 2019-11-17 MED ORDER — MECLIZINE HCL 25 MG PO TABS: 25.0000 mg | ORAL_TABLET | Freq: Three times a day (TID) | ORAL | 6 refills | Status: DC | PRN

## 2019-11-17 MED ORDER — ASPIRIN EC 81 MG PO TBEC: 81.0000 mg | DELAYED_RELEASE_TABLET | Freq: Every day | ORAL | 6 refills | Status: DC

## 2019-11-17 MED ORDER — HYDROXYZINE HCL 50 MG PO TABS: 50.0000 mg | ORAL_TABLET | Freq: Three times a day (TID) | ORAL | 6 refills | Status: DC

## 2019-11-17 MED ORDER — FUROSEMIDE 20 MG PO TABS: 20.0000 mg | ORAL_TABLET | Freq: Every day | ORAL | 6 refills | Status: DC

## 2019-11-17 MED ORDER — GABAPENTIN 300 MG PO CAPS: ORAL_CAPSULE | ORAL | 6 refills | Status: DC

## 2019-11-17 NOTE — Telephone Encounter (Signed)
error 

## 2020-03-20 DIAGNOSIS — E786 Lipoprotein deficiency: Secondary | ICD-10-CM

## 2020-03-20 DIAGNOSIS — E781 Pure hyperglyceridemia: Secondary | ICD-10-CM

## 2020-03-20 HISTORY — DX: Lipoprotein deficiency: E78.6

## 2020-03-20 HISTORY — DX: Pure hyperglyceridemia: E78.1

## 2020-03-23 ENCOUNTER — Other Ambulatory Visit: Payer: Self-pay

## 2020-03-23 ENCOUNTER — Ambulatory Visit (INDEPENDENT_AMBULATORY_CARE_PROVIDER_SITE_OTHER): Payer: Self-pay | Admitting: Family Medicine

## 2020-03-23 ENCOUNTER — Encounter: Payer: Self-pay | Admitting: Family Medicine

## 2020-03-23 VITALS — BP 139/71 | HR 87 | Ht 65.0 in | Wt 264.6 lb

## 2020-03-23 DIAGNOSIS — M5431 Sciatica, right side: Secondary | ICD-10-CM

## 2020-03-23 DIAGNOSIS — Z09 Encounter for follow-up examination after completed treatment for conditions other than malignant neoplasm: Secondary | ICD-10-CM

## 2020-03-23 DIAGNOSIS — I10 Essential (primary) hypertension: Secondary | ICD-10-CM

## 2020-03-23 DIAGNOSIS — R609 Edema, unspecified: Secondary | ICD-10-CM

## 2020-03-23 DIAGNOSIS — R519 Headache, unspecified: Secondary | ICD-10-CM

## 2020-03-23 DIAGNOSIS — R42 Dizziness and giddiness: Secondary | ICD-10-CM

## 2020-03-23 DIAGNOSIS — M5432 Sciatica, left side: Secondary | ICD-10-CM

## 2020-03-23 DIAGNOSIS — Z0000 Encounter for general adult medical examination without abnormal findings: Secondary | ICD-10-CM

## 2020-03-23 DIAGNOSIS — E559 Vitamin D deficiency, unspecified: Secondary | ICD-10-CM

## 2020-03-23 LAB — POCT URINALYSIS DIPSTICK
Bilirubin, UA: NEGATIVE
Blood, UA: NEGATIVE
Glucose, UA: NEGATIVE
Ketones, UA: NEGATIVE
Nitrite, UA: NEGATIVE
Protein, UA: NEGATIVE
Spec Grav, UA: 1.015 (ref 1.010–1.025)
Urobilinogen, UA: 0.2 E.U./dL
pH, UA: 5.5 (ref 5.0–8.0)

## 2020-03-23 LAB — POCT GLYCOSYLATED HEMOGLOBIN (HGB A1C): Hemoglobin A1C: 5.5 % (ref 4.0–5.6)

## 2020-03-23 LAB — GLUCOSE, POCT (MANUAL RESULT ENTRY): POC Glucose: 107 mg/dl — AB (ref 70–99)

## 2020-03-23 NOTE — Progress Notes (Signed)
[facility]   Established Patient Office Visit  Subjective:  Patient ID: Deanna StanfordCherry Bradeen, female    DOB: 07/23/1956  Age: 64 y.o. MRN: [id number]  CC:  Chief Complaint  Patient presents with  . Follow-up    HTN  . Migraine  . Muscle Pain    HPI Deanna StanfordCherry Bertholf is a 64 year old female who presents for Follow Up today.   Past Medical History:  Diagnosis Date  . CAD (coronary artery disease), native coronary artery    s/p Angioplast only in 2011 @ [facility]  . Hyperlipidemia LDL goal <70   . Hypertension   . Hypertriglyceridemia 03/2020  . Hypolipidemia 03/2020  . Muscle spasm   . Myocardial infarction (HCC) 2011  . Syncope   . Tobacco abuse   . Vitamin A deficiency     Current Status: Since her last office visit, she is doing well with no complaints. She states that she will be retiring 05/19/2020, and plans to relocate to [place] soon after. She denies visual changes, chest pain, cough, shortness of breath, heart palpitations, and falls. She has occasional headaches and dizziness with position changes. Denies severe headaches, confusion, seizures, double vision, and blurred vision, nausea and vomiting. She denies fevers, chills, fatigue, recent infections, weight loss, and night sweats. Denies GI problems such as diarrhea, and constipation. She has no reports of blood in stools, dysuria and hematuria. Her anxiety is stable today. She denies suicidal ideations, homicidal ideations, or auditory hallucinations. She is taking all medications as prescribed. She continues to have chronic pain issues.   Past Surgical History:  Procedure Laterality Date  . ABDOMINAL HYSTERECTOMY  1983  . APPENDECTOMY     Age 64  . BREAST SURGERY Bilateral    Removed ducts from both breast  . CHOLECYSTECTOMY  1995  . RIGHT/LEFT HEART CATH AND CORONARY ANGIOGRAPHY N/A 02/06/2018   Procedure: RIGHT/LEFT HEART CATH AND CORONARY ANGIOGRAPHY;  Surgeon:  Corky CraftsVaranasi, Jayadeep S, MD;  [facility];  Service: Cardiovascular;  Laterality: N/A;  . SHOULDER SURGERY Right    Rotater cuff repair  . TONSILLECTOMY     Age 64    Family History  Problem Relation Age of Onset  . Stroke Mother   . Colon cancer Mother   . Lung cancer Mother   . Hypothyroidism Father   . Diabetes Father   . CAD Father   . Heart disease Maternal Grandfather   . Clotting disorder Maternal Grandfather   . Heart disease Paternal Grandmother     Social History   Socioeconomic History  . Marital status: Married    Spouse name: Not on file  . Number of children: Not on file  . Years of education: Not on file  . Highest education level: Not on file  Occupational History  . Not on file  Tobacco Use  . Smoking status: Never Smoker  . Smokeless tobacco: Never Used  Substance and Sexual Activity  . Alcohol use: Yes  . Drug use: No  . Sexual activity: Yes  Other Topics Concern  . Not on file  Social History Narrative  . Not on file   Social Determinants of Health   Financial Resource Strain:   . Difficulty of Paying Living Expenses:   Food Insecurity:   . Worried About Programme researcher, broadcasting/film/videounning Out of Food in the Last Year:   . Baristaan Out of Food in the Last Year:   Transportation Needs:   .  Lack of Transportation (Medical):   Marland Kitchen Lack of Transportation (Non-Medical):   Physical Activity:   . Days of Exercise per Week:   . Minutes of Exercise per Session:   Stress:   . Feeling of Stress :   Social Connections:   . Frequency of Communication with Friends and Family:   . Frequency of Social Gatherings with Friends and Family:   . Attends Religious Services:   . Active Member of Clubs or Organizations:   . Attends Banker Meetings:   Marland Kitchen Marital Status:   Intimate Partner Violence:   . Fear of Current or Ex-Partner:   . Emotionally Abused:   Marland Kitchen Physically Abused:   . Sexually Abused:     Outpatient Medications Prior to Visit  Medication Sig  Dispense Refill  . calcium carbonate (TUMS - DOSED IN MG ELEMENTAL CALCIUM) 500 MG chewable tablet Chew 2 tablets by mouth as needed for indigestion or heartburn.    . meclizine (ANTIVERT) 25 MG tablet Take 1 tablet (25 mg total) by mouth 3 (three) times daily as needed for dizziness. 90 tablet 6  . naproxen sodium (ALEVE) 220 MG tablet Take 220 mg by mouth 2 (two) times daily as needed (pain).    . Vitamin D, Ergocalciferol, (DRISDOL) 1.25 MG (50000 UT) CAPS capsule Take 1 capsule (50,000 Units total) by mouth every 7 (seven) days. 5 capsule 6  . nitroGLYCERIN (NITROSTAT) 0.4 MG SL tablet Place 1 tablet (0.4 mg total) under the tongue every 5 (five) minutes as needed for up to 30 days for chest pain. (Patient not taking: Reported on 09/24/2019) 100 tablet 3  . acetaminophen (TYLENOL) 500 MG tablet Take 1 tablet (500 mg total) by mouth 2 (two) times a day. 60 tablet 0  . aspirin EC 81 MG tablet Take 1 tablet (81 mg total) by mouth daily. 30 tablet 6  . cephALEXin (KEFLEX) 500 MG capsule 1 cap po bid x 7 days 14 capsule 0  . furosemide (LASIX) 20 MG tablet Take 1 tablet (20 mg total) by mouth daily. 30 tablet 6  . gabapentin (NEURONTIN) 300 MG capsule Take 2 capsules (600 mg= total), 3 times a day. 180 capsule 6  . hydrOXYzine (ATARAX/VISTARIL) 50 MG tablet Take 1 tablet (50 mg total) by mouth 3 (three) times daily. 90 tablet 6  . lisinopril (ZESTRIL) 20 MG tablet Take 1 tablet (20 mg total) by mouth daily. 30 tablet 6  . metoprolol succinate (TOPROL-XL) 50 MG 24 hr tablet Take 1 tablet (50 mg total) by mouth daily. Take with or immediately following a meal. 30 tablet 6  . sulfamethoxazole-trimethoprim (BACTRIM DS) 800-160 MG tablet Take 1 tablet by mouth 2 (two) times daily. (Patient not taking: Reported on 03/23/2020) 14 tablet 0   No facility-administered medications prior to visit.    Allergies  Allergen Reactions  . Morphine And Related Hives and Shortness Of Breath  . Statins Other (See  Comments)    Muscle cramps  . Xanax [Alprazolam]     Over sedated, possible sleep walking   . Demerol [Meperidine]     Migraines   . Other     [place] Nuts - tongue swelling, mouth itching     ROS Review of Systems  Constitutional: Negative.   HENT: Negative.   Eyes: Negative.   Respiratory: Negative.   Cardiovascular: Negative.   Gastrointestinal: Positive for abdominal distention.  Endocrine: Negative.   Genitourinary: Negative.   Musculoskeletal: Positive for arthralgias (generalized joint pain) and  back pain (chronic ).  Skin: Negative.   Allergic/Immunologic: Negative.   Neurological: Positive for dizziness (occasional ) and headaches (occasional ).  Hematological: Negative.   Psychiatric/Behavioral: Negative.       Objective:    Physical Exam  Constitutional: She is oriented to person, place, and time. She appears well-developed and well-nourished.  HENT:  Head: Normocephalic and atraumatic.  Eyes: Conjunctivae are normal.  Cardiovascular: Normal rate, regular rhythm, normal heart sounds and intact distal pulses.  Pulmonary/Chest: Effort normal and breath sounds normal.  Abdominal: Soft. Bowel sounds are normal.  Musculoskeletal:        General: Normal range of motion.     Cervical back: Normal range of motion and neck supple.  Neurological: She is alert and oriented to person, place, and time. She has normal reflexes.  Skin: Skin is warm and dry.  Psychiatric: She has a normal mood and affect. Her behavior is normal. Judgment and thought content normal.  Nursing note and vitals reviewed.   BP 139/71   Pulse 87   Ht 5\' 5"  (1.651 m)   Wt 264 lb 9.6 oz (120 kg)   SpO2 95%   BMI 44.03 kg/m  Wt Readings from Last 3 Encounters:  03/23/20 264 lb 9.6 oz (120 kg)  09/24/19 252 lb 9.6 oz (114.6 kg)  04/02/19 257 lb (116.6 kg)     Health Maintenance Due  Topic Date Due  . Hepatitis C Screening  Never done  . HIV Screening  Never done  . COVID-19 Vaccine  (1) Never done  . TETANUS/TDAP  Never done  . PAP SMEAR-Modifier  Never done  . MAMMOGRAM  Never done  . COLONOSCOPY  Never done    There are no preventive care reminders to display for this patient.  Lab Results  Component Value Date   TSH 1.330 03/23/2020   Lab Results  Component Value Date   WBC 9.2 03/23/2020   HGB 15.6 03/23/2020   HCT 45.8 03/23/2020   MCV 94 03/23/2020   PLT 264 03/23/2020   Lab Results  Component Value Date   NA 141 03/23/2020   K 4.3 03/23/2020   CO2 25 03/23/2020   GLUCOSE 97 03/23/2020   BUN 16 03/23/2020   CREATININE 0.88 03/23/2020   BILITOT 0.3 03/23/2020   ALKPHOS 66 03/23/2020   AST 16 03/23/2020   ALT 16 03/23/2020   PROT 7.0 03/23/2020   ALBUMIN 4.4 03/23/2020   CALCIUM 10.0 03/23/2020   ANIONGAP 11 05/29/2018   Lab Results  Component Value Date   CHOL 222 (H) 03/23/2020   Lab Results  Component Value Date   HDL 31 (L) 03/23/2020   Lab Results  Component Value Date   LDLCALC 141 (H) 03/23/2020   Lab Results  Component Value Date   TRIG 272 (H) 03/23/2020   Lab Results  Component Value Date   CHOLHDL 7.2 (H) 03/23/2020   Lab Results  Component Value Date   HGBA1C 5.5 03/23/2020      Assessment & Plan:   1. Essential hypertension The current medical regimen is effective; blood pressure is stable at 139/71 today; continue present plan and medications as prescribed. He will continue to take medications as prescribed, to decrease high sodium intake, excessive alcohol intake, increase potassium intake, smoking cessation, and increase physical activity of at least 30 minutes of cardio activity daily. He will continue to follow Heart Healthy or DASH diet. - CBC with Differential - Comprehensive metabolic panel - Lipid Panel -  TSH - Vitamin B12 - Vitamin D, 25-hydroxy - furosemide (LASIX) 20 MG tablet; Take 1 tablet (20 mg total) by mouth daily.  Dispense: 30 tablet; Refill: 6 - hydrOXYzine (ATARAX/VISTARIL) 50 MG  tablet; Take 1 tablet (50 mg total) by mouth 3 (three) times daily.  Dispense: 90 tablet; Refill: 6 - lisinopril (ZESTRIL) 20 MG tablet; Take 1 tablet (20 mg total) by mouth daily.  Dispense: 30 tablet; Refill: 6 - metoprolol succinate (TOPROL-XL) 50 MG 24 hr tablet; Take 1 tablet (50 mg total) by mouth daily. Take with or immediately following a meal.  Dispense: 30 tablet; Refill: 6  2. Dizziness Stable today. She will continue Meclizine as needed.   3. Nonintractable headache, unspecified chronicity pattern, unspecified headache type Stable today.   4. Bilateral sciatica - acetaminophen (TYLENOL) 500 MG tablet; Take 1 tablet (500 mg total) by mouth every 6 (six) hours as needed.  Dispense: 60 tablet; Refill: 11 - gabapentin (NEURONTIN) 300 MG capsule; Take 2 capsules (600 mg= total), 3 times a day.  Dispense: 180 capsule; Refill: 6  5. Edema, unspecified type - furosemide (LASIX) 20 MG tablet; Take 1 tablet (20 mg total) by mouth daily.  Dispense: 30 tablet; Refill: 6  6. Vitamin D deficiency - Vitamin D, Ergocalciferol, (DRISDOL) 1.25 MG (50000 UNIT) CAPS capsule; Take 1 capsule (50,000 Units total) by mouth every 7 (seven) days.  Dispense: 5 capsule; Refill: 6  7. Health care maintenance - POCT urinalysis dipstick - POCT glycosylated hemoglobin (Hb A1C) - POCT glucose (manual entry)  8. Follow up She is planning on relocating to [place] soon. She will follow up as needed until then.   Meds ordered this encounter  Medications  . acetaminophen (TYLENOL) 500 MG tablet    Sig: Take 1 tablet (500 mg total) by mouth every 6 (six) hours as needed.    Dispense:  60 tablet    Refill:  11  . aspirin EC 81 MG tablet    Sig: Take 1 tablet (81 mg total) by mouth daily.    Dispense:  30 tablet    Refill:  11  . furosemide (LASIX) 20 MG tablet    Sig: Take 1 tablet (20 mg total) by mouth daily.    Dispense:  30 tablet    Refill:  6  . gabapentin (NEURONTIN) 300 MG capsule    Sig:  Take 2 capsules (600 mg= total), 3 times a day.    Dispense:  180 capsule    Refill:  6  . hydrOXYzine (ATARAX/VISTARIL) 50 MG tablet    Sig: Take 1 tablet (50 mg total) by mouth 3 (three) times daily.    Dispense:  90 tablet    Refill:  6  . lisinopril (ZESTRIL) 20 MG tablet    Sig: Take 1 tablet (20 mg total) by mouth daily.    Dispense:  30 tablet    Refill:  6  . metoprolol succinate (TOPROL-XL) 50 MG 24 hr tablet    Sig: Take 1 tablet (50 mg total) by mouth daily. Take with or immediately following a meal.    Dispense:  30 tablet    Refill:  6  . Vitamin D, Ergocalciferol, (DRISDOL) 1.25 MG (50000 UNIT) CAPS capsule    Sig: Take 1 capsule (50,000 Units total) by mouth every 7 (seven) days.    Dispense:  5 capsule    Refill:  6    Orders Placed This Encounter  Procedures  . CBC with Differential  . Comprehensive  metabolic panel  . Lipid Panel  . TSH  . Vitamin B12  . Vitamin D, 25-hydroxy  . POCT urinalysis dipstick  . POCT glycosylated hemoglobin (Hb A1C)  . POCT glucose (manual entry)    Referral Orders  No referral(s) requested today   Raliegh Ip,  MSN, FNP-BC [facility] [address] [place], [place] [address] [phone] fax    Problem List Items Addressed This Visit      Cardiovascular and Mediastinum   Hypertension - Primary   Relevant Medications   aspirin EC 81 MG tablet   furosemide (LASIX) 20 MG tablet   hydrOXYzine (ATARAX/VISTARIL) 50 MG tablet   lisinopril (ZESTRIL) 20 MG tablet   metoprolol succinate (TOPROL-XL) 50 MG 24 hr tablet   Other Relevant Orders   CBC with Differential (Completed)   Comprehensive metabolic panel (Completed)   Lipid Panel (Completed)   TSH (Completed)   Vitamin B12 (Completed)   Vitamin D, 25-hydroxy (Completed)     Nervous and Auditory   Bilateral sciatica   Relevant Medications   acetaminophen (TYLENOL) 500 MG tablet     gabapentin (NEURONTIN) 300 MG capsule   hydrOXYzine (ATARAX/VISTARIL) 50 MG tablet     Other   Dizziness    Other Visit Diagnoses    Nonintractable headache, unspecified chronicity pattern, unspecified headache type       Relevant Medications   acetaminophen (TYLENOL) 500 MG tablet   aspirin EC 81 MG tablet   gabapentin (NEURONTIN) 300 MG capsule   metoprolol succinate (TOPROL-XL) 50 MG 24 hr tablet   Edema, unspecified type       Relevant Medications   furosemide (LASIX) 20 MG tablet   Vitamin D deficiency       Relevant Medications   Vitamin D, Ergocalciferol, (DRISDOL) 1.25 MG (50000 UNIT) CAPS capsule   Health care maintenance       Relevant Orders   POCT urinalysis dipstick (Completed)   POCT glycosylated hemoglobin (Hb A1C) (Completed)   POCT glucose (manual entry) (Completed)   Follow up          Meds ordered this encounter  Medications  . acetaminophen (TYLENOL) 500 MG tablet    Sig: Take 1 tablet (500 mg total) by mouth every 6 (six) hours as needed.    Dispense:  60 tablet    Refill:  11  . aspirin EC 81 MG tablet    Sig: Take 1 tablet (81 mg total) by mouth daily.    Dispense:  30 tablet    Refill:  11  . furosemide (LASIX) 20 MG tablet    Sig: Take 1 tablet (20 mg total) by mouth daily.    Dispense:  30 tablet    Refill:  6  . gabapentin (NEURONTIN) 300 MG capsule    Sig: Take 2 capsules (600 mg= total), 3 times a day.    Dispense:  180 capsule    Refill:  6  . hydrOXYzine (ATARAX/VISTARIL) 50 MG tablet    Sig: Take 1 tablet (50 mg total) by mouth 3 (three) times daily.    Dispense:  90 tablet    Refill:  6  . lisinopril (ZESTRIL) 20 MG tablet    Sig: Take 1 tablet (20 mg total) by mouth daily.    Dispense:  30 tablet    Refill:  6  . metoprolol succinate (TOPROL-XL) 50 MG 24 hr tablet    Sig: Take 1 tablet (  50 mg total) by mouth daily. Take with or immediately following a meal.    Dispense:  30 tablet    Refill:  6  . Vitamin D,  Ergocalciferol, (DRISDOL) 1.25 MG (50000 UNIT) CAPS capsule    Sig: Take 1 capsule (50,000 Units total) by mouth every 7 (seven) days.    Dispense:  5 capsule    Refill:  6    Follow-up: No follow-ups on file.    Kallie Locks, FNP

## 2020-03-24 ENCOUNTER — Other Ambulatory Visit: Payer: Self-pay | Admitting: Family Medicine

## 2020-03-24 ENCOUNTER — Other Ambulatory Visit: Payer: Self-pay

## 2020-03-24 ENCOUNTER — Encounter: Payer: Self-pay | Admitting: Family Medicine

## 2020-03-24 DIAGNOSIS — E782 Mixed hyperlipidemia: Secondary | ICD-10-CM

## 2020-03-24 DIAGNOSIS — E559 Vitamin D deficiency, unspecified: Secondary | ICD-10-CM

## 2020-03-24 LAB — COMPREHENSIVE METABOLIC PANEL
ALT: 16 IU/L (ref 0–32)
AST: 16 IU/L (ref 0–40)
Albumin/Globulin Ratio: 1.7 (ref 1.2–2.2)
Albumin: 4.4 g/dL (ref 3.8–4.8)
Alkaline Phosphatase: 66 IU/L (ref 39–117)
BUN/Creatinine Ratio: 18 (ref 12–28)
BUN: 16 mg/dL (ref 8–27)
Bilirubin Total: 0.3 mg/dL (ref 0.0–1.2)
CO2: 25 mmol/L (ref 20–29)
Calcium: 10 mg/dL (ref 8.7–10.3)
Chloride: 103 mmol/L (ref 96–106)
Creatinine, Ser: 0.88 mg/dL (ref 0.57–1.00)
GFR calc Af Amer: 81 mL/min/{1.73_m2} (ref >59)
GFR calc non Af Amer: 70 mL/min/{1.73_m2} (ref >59)
Globulin, Total: 2.6 g/dL (ref 1.5–4.5)
Glucose: 97 mg/dL (ref 65–99)
Potassium: 4.3 mmol/L (ref 3.5–5.2)
Sodium: 141 mmol/L (ref 134–144)
Total Protein: 7 g/dL (ref 6.0–8.5)

## 2020-03-24 LAB — CBC WITH DIFFERENTIAL/PLATELET
Basophils Absolute: 0.1 10*3/uL (ref 0.0–0.2)
Basos: 1 %
EOS (ABSOLUTE): 0.5 10*3/uL — ABNORMAL HIGH (ref 0.0–0.4)
Eos: 5 %
Hematocrit: 45.8 % (ref 34.0–46.6)
Hemoglobin: 15.6 g/dL (ref 11.1–15.9)
Immature Grans (Abs): 0 10*3/uL (ref 0.0–0.1)
Immature Granulocytes: 0 %
Lymphocytes Absolute: 3.4 10*3/uL — ABNORMAL HIGH (ref 0.7–3.1)
Lymphs: 37 %
MCH: 31.9 pg (ref 26.6–33.0)
MCHC: 34.1 g/dL (ref 31.5–35.7)
MCV: 94 fL (ref 79–97)
Monocytes Absolute: 0.5 10*3/uL (ref 0.1–0.9)
Monocytes: 5 %
Neutrophils Absolute: 4.7 10*3/uL (ref 1.4–7.0)
Neutrophils: 52 %
Platelets: 264 10*3/uL (ref 150–450)
RBC: 4.89 x10E6/uL (ref 3.77–5.28)
RDW: 12.9 % (ref 11.7–15.4)
WBC: 9.2 10*3/uL (ref 3.4–10.8)

## 2020-03-24 LAB — LIPID PANEL
Chol/HDL Ratio: 7.2 ratio — ABNORMAL HIGH (ref 0.0–4.4)
Cholesterol, Total: 222 mg/dL — ABNORMAL HIGH (ref 100–199)
HDL: 31 mg/dL — ABNORMAL LOW (ref >39)
LDL Chol Calc (NIH): 141 mg/dL — ABNORMAL HIGH (ref 0–99)
Triglycerides: 272 mg/dL — ABNORMAL HIGH (ref 0–149)
VLDL Cholesterol Cal: 50 mg/dL — ABNORMAL HIGH (ref 5–40)

## 2020-03-24 LAB — TSH: TSH: 1.33 u[IU]/mL (ref 0.450–4.500)

## 2020-03-24 LAB — VITAMIN D 25 HYDROXY (VIT D DEFICIENCY, FRACTURES): Vit D, 25-Hydroxy: 19.7 ng/mL — ABNORMAL LOW (ref 30.0–100.0)

## 2020-03-24 LAB — VITAMIN B12: Vitamin B-12: 313 pg/mL (ref 232–1245)

## 2020-03-24 MED ORDER — EZETIMIBE 10 MG PO TABS: 10.0000 mg | ORAL_TABLET | Freq: Every day | ORAL | 3 refills | Status: DC

## 2020-03-24 MED ORDER — VITAMIN D (ERGOCALCIFEROL) 1.25 MG (50000 UNIT) PO CAPS: 50000.0000 [IU] | ORAL_CAPSULE | ORAL | 6 refills | Status: DC

## 2020-03-25 ENCOUNTER — Encounter: Payer: Self-pay | Admitting: Family Medicine

## 2020-03-25 DIAGNOSIS — R519 Headache, unspecified: Secondary | ICD-10-CM | POA: Insufficient documentation

## 2020-03-25 MED ORDER — ASPIRIN EC 81 MG PO TBEC: 81.0000 mg | DELAYED_RELEASE_TABLET | Freq: Every day | ORAL | 11 refills | Status: DC

## 2020-03-25 MED ORDER — VITAMIN D (ERGOCALCIFEROL) 1.25 MG (50000 UNIT) PO CAPS: 50000.0000 [IU] | ORAL_CAPSULE | ORAL | 6 refills | Status: DC

## 2020-03-25 MED ORDER — GABAPENTIN 300 MG PO CAPS: ORAL_CAPSULE | ORAL | 6 refills | Status: DC

## 2020-03-25 MED ORDER — HYDROXYZINE HCL 50 MG PO TABS: 50.0000 mg | ORAL_TABLET | Freq: Three times a day (TID) | ORAL | 6 refills | Status: DC

## 2020-03-25 MED ORDER — LISINOPRIL 20 MG PO TABS: 20.0000 mg | ORAL_TABLET | Freq: Every day | ORAL | 6 refills | Status: DC

## 2020-03-25 MED ORDER — ACETAMINOPHEN 500 MG PO TABS: 500.0000 mg | ORAL_TABLET | Freq: Four times a day (QID) | ORAL | 11 refills | Status: DC | PRN

## 2020-03-25 MED ORDER — METOPROLOL SUCCINATE ER 50 MG PO TB24: 50.0000 mg | ORAL_TABLET | Freq: Every day | ORAL | 6 refills | Status: DC

## 2020-03-25 MED ORDER — FUROSEMIDE 20 MG PO TABS: 20.0000 mg | ORAL_TABLET | Freq: Every day | ORAL | 6 refills | Status: DC

## 2020-04-13 IMAGING — CT CT HEAD W/O CM
3 series · 15 of 47 positions shown, 18 images · non-contrast
Comparison: None.

CLINICAL DATA: Altered level of consciousness. Dizziness, onset
today. Nausea and vomiting.

EXAM:
CT HEAD WITHOUT CONTRAST
TECHNIQUE: Contiguous axial images were obtained from the base of the skull
through the vertex without intravenous contrast.

[Series 2: head wo · axial · 0.46mm/px · z∈[+1450,+1590]mm · 9 of 34 slices shown, 12 images]
[im 3/34  brain]
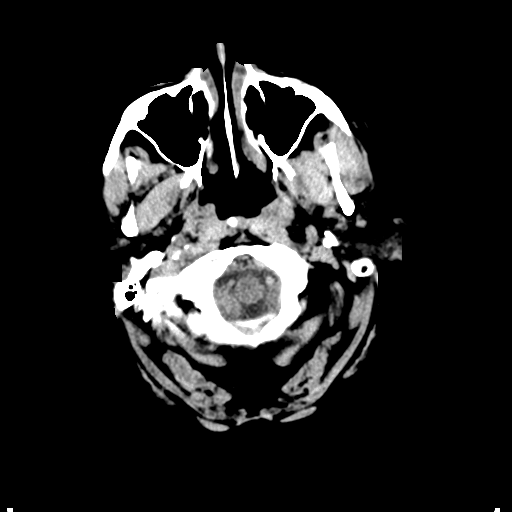
[im 3/34  bone]
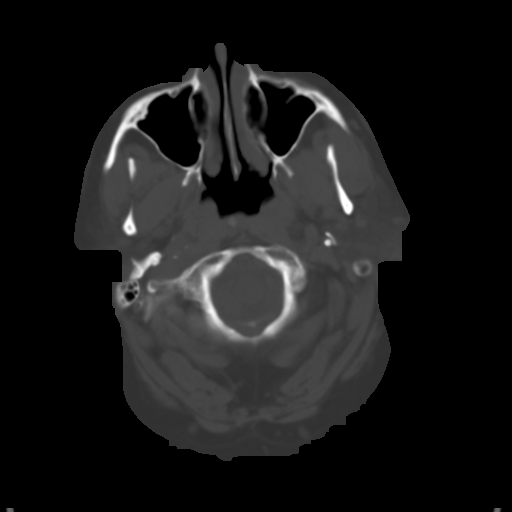
[im 6/34  brain]
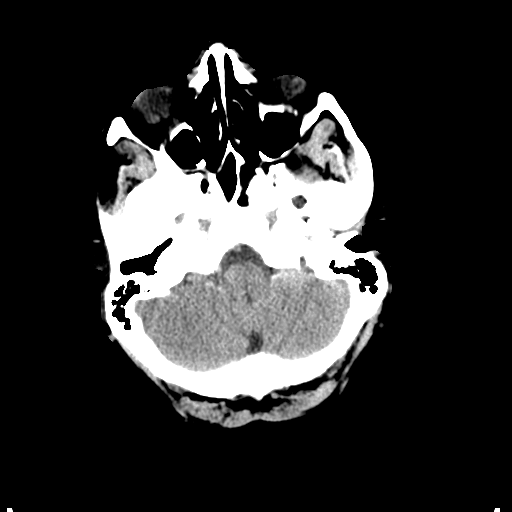
[im 10/34  brain]
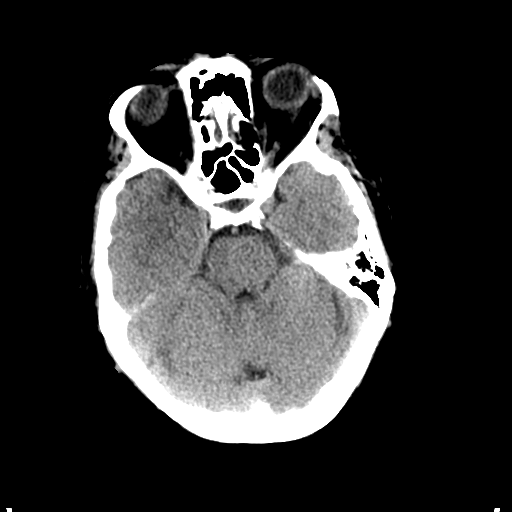
[im 13/34  brain]
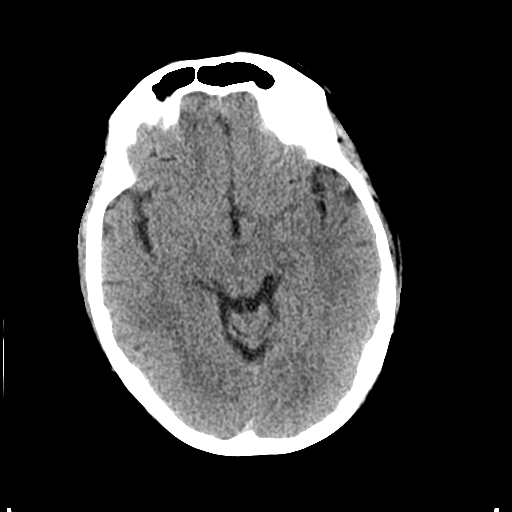
[im 18/34  brain]
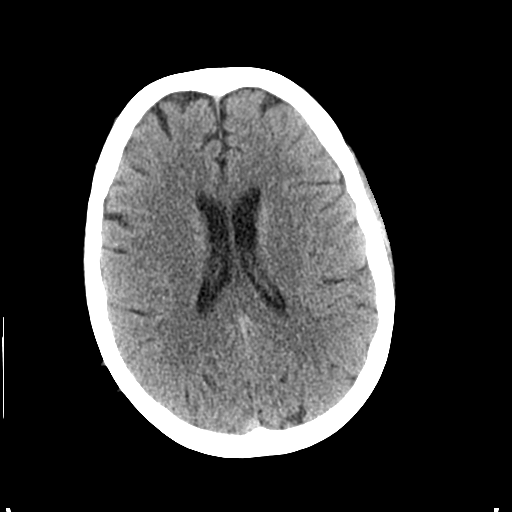
[im 18/34  bone]
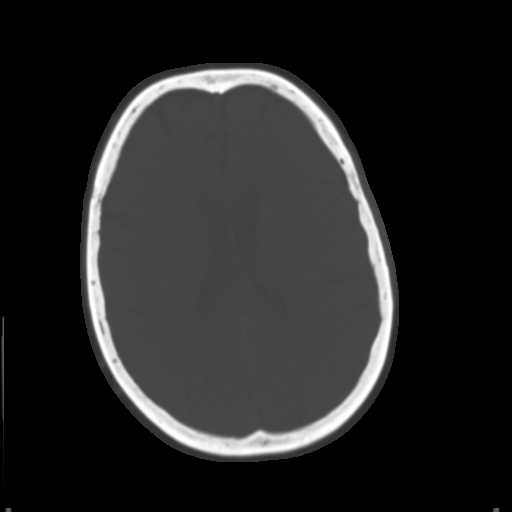
[im 21/34  brain]
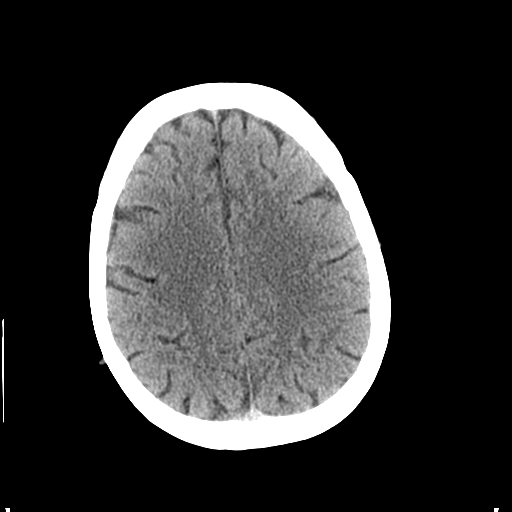
[im 24/34  brain]
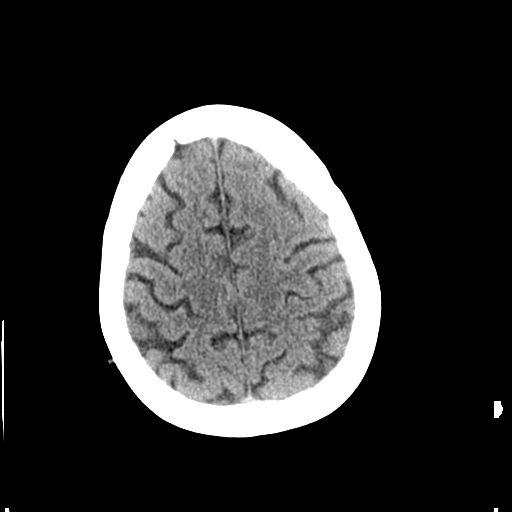
[im 28/34  brain]
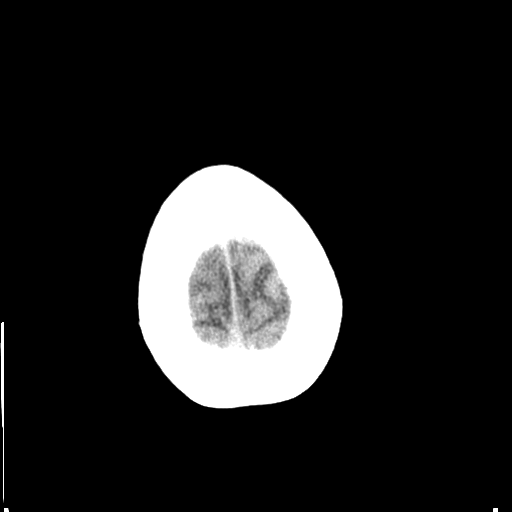
[im 31/34  brain]
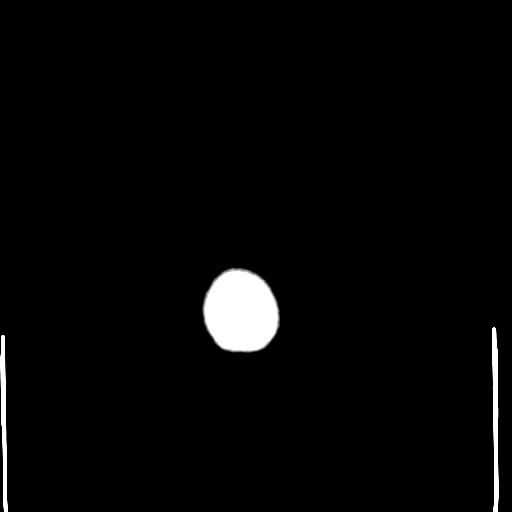
[im 31/34  bone]
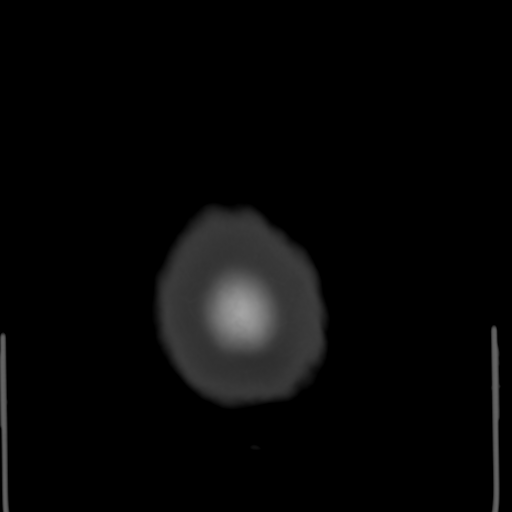

[Series 5: coronal soft tissue · coronal · 0.33mm/px · 3 of 81 slices shown]
[im 27/81  brain]
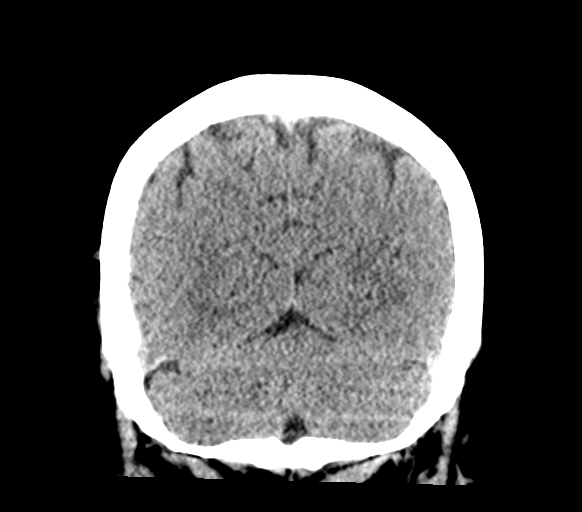
[im 36/81  brain]
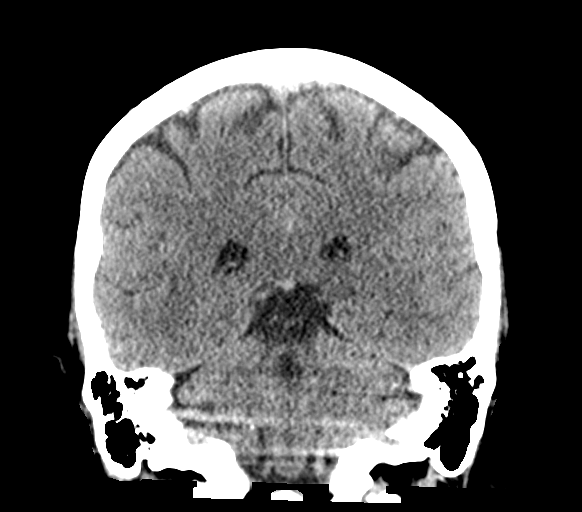
[im 45/81  brain]
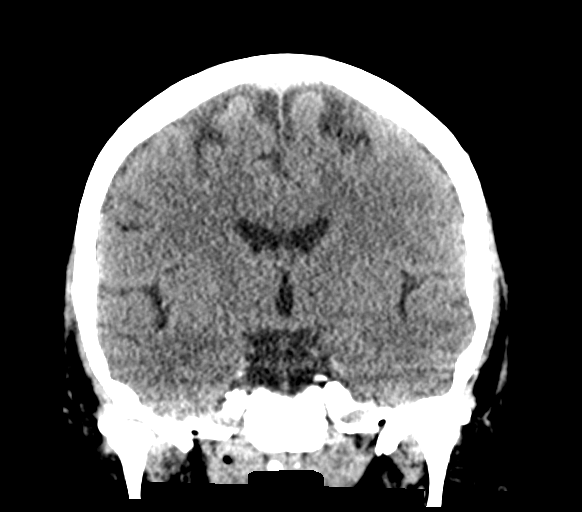

[Series 6: sagittal soft tissue · sagittal · 0.33mm/px · 3 of 68 slices shown]
[im 23/68  brain]
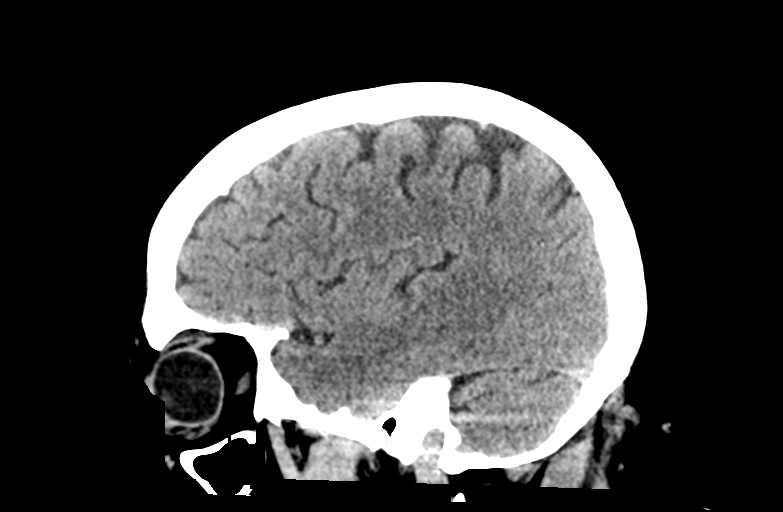
[im 34/68  brain]
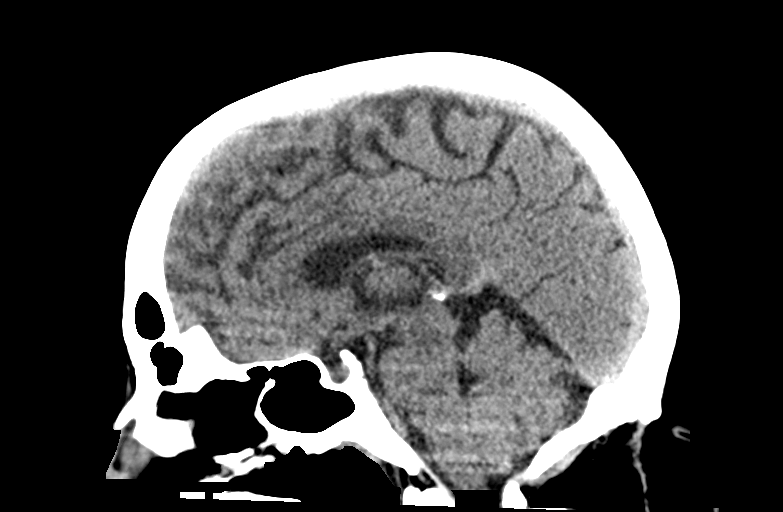
[im 45/68  brain]
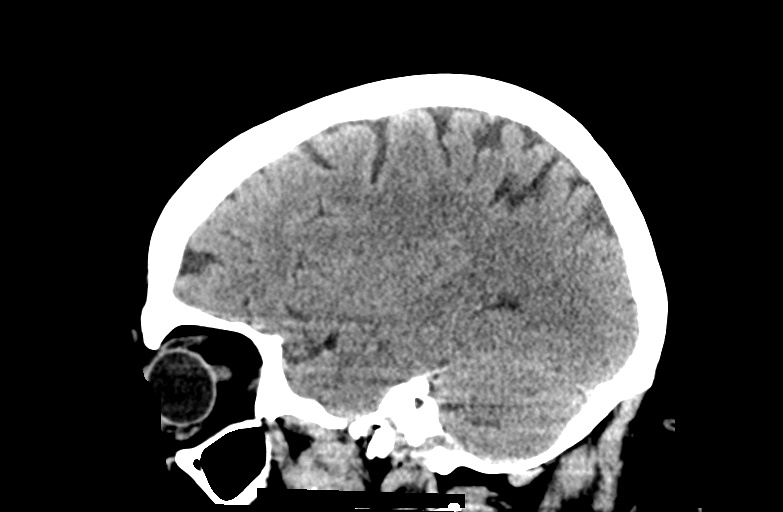

[15 of 47 positions shown; findings below may reference images not displayed]

FINDINGS: Brain: No intracranial hemorrhage, mass effect, or midline shift. No
hydrocephalus. The basilar cisterns are patent. No evidence of
territorial infarct or acute ischemia. No extra-axial or
intracranial fluid collection.

Vascular: No hyperdense vessel or unexpected calcification.

Skull: No fracture or focal lesion.

Sinuses/Orbits: Paranasal sinuses and mastoid air cells are clear.
The visualized orbits are unremarkable.

Other: None.
IMPRESSION: Negative head CT.

## 2020-08-28 ENCOUNTER — Telehealth: Payer: Self-pay | Admitting: Orthopedic Surgery

## 2020-08-28 DIAGNOSIS — R197 Diarrhea, unspecified: Secondary | ICD-10-CM

## 2020-08-28 NOTE — Progress Notes (Signed)
We are sorry that you are not feeling well.  Here is how we plan to help!  I agree that diarrhea for more than a week is very unlikely to be infectious. I will write your work note; it will be available in [place]. If your diarrhea doesn't resolve this week you may need to be seen. Be sure to drink some fluids with electrolytes like Pedialyte or Gatorade.  For your symptoms you may take Imodium 2 mg tablets that are over the counter at your local pharmacy. Take two tablet now and then one after each loose stool up to 6 a day.  Antibiotics are not needed for most people with diarrhea.   HOME CARE  We recommend changing your diet to help with your symptoms for the next few days.  Drink plenty of fluids that contain water salt and sugar. Sports drinks such as Gatorade may help.   You may try broths, soups, bananas, applesauce, soft breads, mashed potatoes or crackers.   You are considered infectious for as long as the diarrhea continues. Hand washing or use of alcohol based hand sanitizers is recommend.  It is best to stay out of work or school until your symptoms stop.   GET HELP RIGHT AWAY  If you have dark yellow colored urine or do not pass urine frequently you should drink more fluids.    If your symptoms worsen   If you feel like you are going to pass out (faint)  You have a new problem  MAKE SURE YOU   Understand these instructions.  Will watch your condition.  Will get help right away if you are not doing well or get worse.  Your e-visit answers were reviewed by a board certified advanced clinical practitioner to complete your personal care plan.  Depending on the condition, your plan could have included both over the counter or prescription medications.  If there is a problem please reply  once you have received a response from your provider.  Your safety is important to [place].  If you have drug allergies check your prescription carefully.    You can use MyChart to ask  questions about today's visit, request a non-urgent call back, or ask for a work or school excuse for 24 hours related to this e-Visit. If it has been greater than 24 hours you will need to follow up with your provider, or enter a new e-Visit to address those concerns.   You will get an e-mail in the next two days asking about your experience.  I hope that your e-visit has been valuable and will speed your recovery. Thank you for using e-visits.   Greater than 5 minutes, yet less than 10 minutes of time have been spent researching, coordinating and implementing care for this patient today.

## 2020-11-10 ENCOUNTER — Encounter (HOSPITAL_COMMUNITY): Payer: Self-pay | Admitting: Emergency Medicine

## 2020-11-10 ENCOUNTER — Emergency Department (HOSPITAL_COMMUNITY)
Admission: EM | Admit: 2020-11-10 | Discharge: 2020-11-10 | Disposition: A | Discharge status: Home / Self Care | Payer: Managed Care, Other (non HMO) | Attending: Emergency Medicine | Admitting: Emergency Medicine

## 2020-11-10 ENCOUNTER — Other Ambulatory Visit: Payer: Self-pay

## 2020-11-10 ENCOUNTER — Emergency Department (HOSPITAL_COMMUNITY): Payer: Managed Care, Other (non HMO)

## 2020-11-10 DIAGNOSIS — R110 Nausea: Secondary | ICD-10-CM | POA: Insufficient documentation

## 2020-11-10 DIAGNOSIS — Z5321 Procedure and treatment not carried out due to patient leaving prior to being seen by health care provider: Secondary | ICD-10-CM | POA: Insufficient documentation

## 2020-11-10 DIAGNOSIS — R0789 Other chest pain: Secondary | ICD-10-CM | POA: Insufficient documentation

## 2020-11-10 DIAGNOSIS — R0602 Shortness of breath: Secondary | ICD-10-CM | POA: Diagnosis not present

## 2020-11-10 LAB — BASIC METABOLIC PANEL
Anion gap: 9 (ref 5–15)
BUN: 12 mg/dL (ref 8–23)
CO2: 26 mmol/L (ref 22–32)
Calcium: 9.3 mg/dL (ref 8.9–10.3)
Chloride: 106 mmol/L (ref 98–111)
Creatinine, Ser: 0.86 mg/dL (ref 0.44–1.00)
GFR, Estimated: >60 mL/min (ref >60)
Glucose, Bld: 105 mg/dL — ABNORMAL HIGH (ref 70–99)
Potassium: 3.7 mmol/L (ref 3.5–5.1)
Sodium: 141 mmol/L (ref 135–145)

## 2020-11-10 LAB — CBC
HCT: 46 % (ref 36.0–46.0)
Hemoglobin: 15.1 g/dL — ABNORMAL HIGH (ref 12.0–15.0)
MCH: 32.1 pg (ref 26.0–34.0)
MCHC: 32.8 g/dL (ref 30.0–36.0)
MCV: 97.7 fL (ref 80.0–100.0)
Platelets: 267 10*3/uL (ref 150–400)
RBC: 4.71 MIL/uL (ref 3.87–5.11)
RDW: 13 % (ref 11.5–15.5)
WBC: 11 10*3/uL — ABNORMAL HIGH (ref 4.0–10.5)
nRBC: 0 % (ref 0.0–0.2)

## 2020-11-10 LAB — TROPONIN I (HIGH SENSITIVITY): Troponin I (High Sensitivity): 6 ng/L (ref <18)

## 2020-11-10 NOTE — ED Triage Notes (Signed)
Patient states for the past few hours she has had chest pain and pressure, nausea. "I feel like I have a tight band around my ribcage." Patient took one dose of nitro at home. Reports SOB.

## 2020-11-10 NOTE — ED Notes (Signed)
Pt told the screeners she was leaving

## 2020-11-16 ENCOUNTER — Ambulatory Visit (INDEPENDENT_AMBULATORY_CARE_PROVIDER_SITE_OTHER): Payer: Managed Care, Other (non HMO) | Admitting: Family Medicine

## 2020-11-16 ENCOUNTER — Telehealth: Payer: Self-pay | Admitting: Family Medicine

## 2020-11-16 ENCOUNTER — Other Ambulatory Visit: Payer: Self-pay

## 2020-11-16 ENCOUNTER — Encounter: Payer: Self-pay | Admitting: Family Medicine

## 2020-11-16 VITALS — BP 128/69 | HR 91 | Temp 97.7°F | Wt 256.6 lb

## 2020-11-16 DIAGNOSIS — R002 Palpitations: Secondary | ICD-10-CM

## 2020-11-16 DIAGNOSIS — Z9842 Cataract extraction status, left eye: Secondary | ICD-10-CM

## 2020-11-16 DIAGNOSIS — M5431 Sciatica, right side: Secondary | ICD-10-CM

## 2020-11-16 DIAGNOSIS — R609 Edema, unspecified: Secondary | ICD-10-CM

## 2020-11-16 DIAGNOSIS — R110 Nausea: Secondary | ICD-10-CM

## 2020-11-16 DIAGNOSIS — F419 Anxiety disorder, unspecified: Secondary | ICD-10-CM

## 2020-11-16 DIAGNOSIS — R0789 Other chest pain: Secondary | ICD-10-CM

## 2020-11-16 DIAGNOSIS — I252 Old myocardial infarction: Secondary | ICD-10-CM

## 2020-11-16 DIAGNOSIS — I10 Essential (primary) hypertension: Secondary | ICD-10-CM

## 2020-11-16 DIAGNOSIS — R42 Dizziness and giddiness: Secondary | ICD-10-CM

## 2020-11-16 DIAGNOSIS — Z09 Encounter for follow-up examination after completed treatment for conditions other than malignant neoplasm: Secondary | ICD-10-CM

## 2020-11-16 DIAGNOSIS — Z9841 Cataract extraction status, right eye: Secondary | ICD-10-CM

## 2020-11-16 DIAGNOSIS — M5432 Sciatica, left side: Secondary | ICD-10-CM

## 2020-11-16 MED ORDER — HYDROXYZINE HCL 50 MG PO TABS: 50.0000 mg | ORAL_TABLET | Freq: Three times a day (TID) | ORAL | 3 refills | Status: DC

## 2020-11-16 MED ORDER — METOPROLOL SUCCINATE ER 50 MG PO TB24: 50.0000 mg | ORAL_TABLET | Freq: Every day | ORAL | 3 refills | Status: DC

## 2020-11-16 MED ORDER — FUROSEMIDE 20 MG PO TABS: 20.0000 mg | ORAL_TABLET | Freq: Every day | ORAL | 3 refills | Status: DC

## 2020-11-16 MED ORDER — ASPIRIN EC 81 MG PO TBEC: 81.0000 mg | DELAYED_RELEASE_TABLET | Freq: Every day | ORAL | 3 refills | Status: AC

## 2020-11-16 MED ORDER — LISINOPRIL 20 MG PO TABS: 20.0000 mg | ORAL_TABLET | Freq: Every day | ORAL | 3 refills | Status: DC

## 2020-11-16 MED ORDER — GABAPENTIN 300 MG PO CAPS
ORAL_CAPSULE | ORAL | 6 refills | Status: DC
Start: 2020-11-16 — End: 2021-06-20

## 2020-11-16 MED ORDER — NITROGLYCERIN 0.4 MG SL SUBL: 0.4000 mg | SUBLINGUAL_TABLET | SUBLINGUAL | 3 refills | Status: AC | PRN

## 2020-11-16 MED ORDER — MECLIZINE HCL 25 MG PO TABS: 25.0000 mg | ORAL_TABLET | Freq: Three times a day (TID) | ORAL | 6 refills | Status: AC | PRN

## 2020-11-16 MED ORDER — ACETAMINOPHEN 500 MG PO TABS
500.0000 mg | ORAL_TABLET | Freq: Four times a day (QID) | ORAL | 11 refills | Status: AC | PRN
Start: 2020-11-16 — End: ?

## 2020-11-16 NOTE — Telephone Encounter (Signed)
Done

## 2020-11-16 NOTE — Progress Notes (Signed)
[facility]    Established Patient Office Visit  Subjective:  Patient ID: Deanna Dudley Kiesler, female    DOB: 08/30/1956  Age: 64 y.o. MRN: [id number]  CC: No chief complaint on file.   HPI Deanna Dudley Hensch is a 64 year old female who presents for Hospital Follow Up today.    Patient Active Problem List   Diagnosis Date Noted  . Nonintractable headache 03/25/2020  . Bilateral sciatica 03/13/2019  . Dizziness 03/13/2019  . Angina pectoris (HCC)   . Paresthesia 02/05/2018  . Hypertension   . Muscle spasm   . Myocardial infarction (HCC) 11/20/2009   Current Status: Since her last office visit, she has had and ED visit on 11/10/2020 for Chest Heaviness and Heart Palpitations. Today she is doing well with no complaints. She has history of MI in 2011. Her anxiety is increased today, r/t the stress of the high demands of her job. She has been employed on her current job since 06/2020. No reports of suicidal ideations, homicidal ideations, or auditory hallucinations. She has history of muscle spasms and notice that they have returned should her stress level has increased. She recently had cataract surgery and no longer has to wear glasses. She denies chest pain, cough, shortness of breath, heart palpitations, and falls. She has occasional headaches and dizziness with position changes. Denies severe headaches, confusion, seizures, double vision, and blurred vision, nausea and vomiting. She denies fevers, chills, fatigue, recent infections, weight loss, and night sweats. Denies GI problems such as nausea, vomiting, diarrhea, and constipation. She has no reports of blood in stools, dysuria and hematuria. She is taking all medications as prescribed.   Past Medical History:  Diagnosis Date  . CAD (coronary artery disease), native coronary artery    s/p Angioplast only in 2011 @ [place]  . Headache   . Hyperlipidemia LDL goal <70   . Hypertension   .  Hypertriglyceridemia 03/2020  . Hypolipidemia 03/2020  . Muscle spasm   . Myocardial infarction (HCC) 2011  . Syncope   . Tobacco abuse   . Vitamin A deficiency     Past Surgical History:  Procedure Laterality Date  . ABDOMINAL HYSTERECTOMY  1983  . APPENDECTOMY     Age 64  . BREAST SURGERY Bilateral    Removed ducts from both breast  . CHOLECYSTECTOMY  1995  . RIGHT/LEFT HEART CATH AND CORONARY ANGIOGRAPHY N/A 02/06/2018   Procedure: RIGHT/LEFT HEART CATH AND CORONARY ANGIOGRAPHY;  Surgeon: Corky CraftsVaranasi, Jayadeep S, MD;  [facility];  Service: Cardiovascular;  Laterality: N/A;  . SHOULDER SURGERY Right    Rotater cuff repair  . TONSILLECTOMY     Age 64    Family History  Problem Relation Age of Onset  . Stroke Mother   . Colon cancer Mother   . Lung cancer Mother   . Hypothyroidism Father   . Diabetes Father   . CAD Father   . Heart disease Maternal Grandfather   . Clotting disorder Maternal Grandfather   . Heart disease Paternal Grandmother     Social History   Socioeconomic History  . Marital status: Married    Spouse name: Not on file  . Number of children: Not on file  . Years of education: Not on file  . Highest education level: Not on file  Occupational History  . Not on file  Tobacco Use  . Smoking status: Never Smoker  . Smokeless tobacco: Never Used  Vaping  Use  . Vaping Use: Never used  Substance and Sexual Activity  . Alcohol use: Yes  . Drug use: No  . Sexual activity: Yes  Other Topics Concern  . Not on file  Social History Narrative  . Not on file   Social Determinants of Health   Financial Resource Strain: Not on file  Food Insecurity: Not on file  Transportation Needs: Not on file  Physical Activity: Not on file  Stress: Not on file  Social Connections: Not on file  Intimate Partner Violence: Not on file    Outpatient Medications Prior to Visit  Medication Sig Dispense Refill  . Multiple Vitamins-Minerals  (MULTIVITAMIN WITH MINERALS) tablet Take 1 tablet by mouth daily.    Marland Kitchen acetaminophen (TYLENOL) 500 MG tablet Take 1 tablet (500 mg total) by mouth every 6 (six) hours as needed. 60 tablet 11  . aspirin EC 81 MG tablet Take 1 tablet (81 mg total) by mouth daily. 30 tablet 11  . furosemide (LASIX) 20 MG tablet Take 1 tablet (20 mg total) by mouth daily. 30 tablet 6  . gabapentin (NEURONTIN) 300 MG capsule Take 2 capsules (600 mg= total), 3 times a day. 180 capsule 6  . hydrOXYzine (ATARAX/VISTARIL) 50 MG tablet Take 1 tablet (50 mg total) by mouth 3 (three) times daily. 90 tablet 6  . lisinopril (ZESTRIL) 20 MG tablet Take 1 tablet (20 mg total) by mouth daily. 30 tablet 6  . meclizine (ANTIVERT) 25 MG tablet Take 1 tablet (25 mg total) by mouth 3 (three) times daily as needed for dizziness. 90 tablet 6  . metoprolol succinate (TOPROL-XL) 50 MG 24 hr tablet Take 1 tablet (50 mg total) by mouth daily. Take with or immediately following a meal. 30 tablet 6  . nitroGLYCERIN (NITROSTAT) 0.4 MG SL tablet Place 1 tablet (0.4 mg total) under the tongue every 5 (five) minutes as needed for up to 30 days for chest pain. 100 tablet 3  . Vitamin D, Ergocalciferol, (DRISDOL) 1.25 MG (50000 UNIT) CAPS capsule Take 1 capsule (50,000 Units total) by mouth every 7 (seven) days. (Patient not taking: Reported on 11/16/2020) 5 capsule 6  . calcium carbonate (TUMS - DOSED IN MG ELEMENTAL CALCIUM) 500 MG chewable tablet Chew 2 tablets by mouth as needed for indigestion or heartburn.    . ezetimibe (ZETIA) 10 MG tablet Take 1 tablet (10 mg total) by mouth daily. 30 tablet 3  . naproxen sodium (ALEVE) 220 MG tablet Take 220 mg by mouth 2 (two) times daily as needed (pain).     No facility-administered medications prior to visit.    Allergies  Allergen Reactions  . Morphine And Related Hives and Shortness Of Breath  . Statins Other (See Comments)    Muscle cramps  . Xanax [Alprazolam]     Over sedated, possible sleep  walking   . Demerol [Meperidine]     Migraines   . Other     [place] Nuts - tongue swelling, mouth itching     ROS Review of Systems  Constitutional: Negative.   HENT: Negative.   Eyes: Negative.   Respiratory: Positive for shortness of breath.   Cardiovascular: Positive for palpitations (occsional ).       Occasional chest discomfort r/t stress/anxiety  Gastrointestinal: Negative.   Endocrine: Negative.   Genitourinary: Negative.   Musculoskeletal: Positive for arthralgias (generalized joint pain).  Skin: Negative.   Allergic/Immunologic: Negative.   Neurological: Positive for dizziness (occasional ) and headaches (occasional ).  Hematological: Negative.   Psychiatric/Behavioral: Negative.       Objective:    Physical Exam Vitals and nursing note reviewed.  Constitutional:      Appearance: Normal appearance.  HENT:     Head: Normocephalic and atraumatic.     Nose: Nose normal.     Mouth/Throat:     Mouth: Mucous membranes are moist.     Pharynx: Oropharynx is clear.  Cardiovascular:     Rate and Rhythm: Normal rate and regular rhythm.     Pulses: Normal pulses.     Heart sounds: Normal heart sounds.  Pulmonary:     Effort: Pulmonary effort is normal.     Breath sounds: Normal breath sounds.  Abdominal:     General: Bowel sounds are normal.     Palpations: Abdomen is soft.  Musculoskeletal:        General: Normal range of motion.     Cervical back: Normal range of motion and neck supple.     Comments: Sciatica   Skin:    General: Skin is warm.  Neurological:     General: No focal deficit present.     Mental Status: She is alert and oriented to person, place, and time.  Psychiatric:        Mood and Affect: Mood normal.        Behavior: Behavior normal.        Thought Content: Thought content normal.        Judgment: Judgment normal.     BP 128/69 (BP Location: Left Arm, Patient Position: Sitting, Cuff Size: Normal)   Pulse 91   Temp 97.7 F (36.5 C)  (Temporal)   Wt 256 lb 9.6 oz (116.4 kg)   SpO2 99%   BMI 42.70 kg/m  Wt Readings from Last 3 Encounters:  11/16/20 256 lb 9.6 oz (116.4 kg)  11/10/20 266 lb 12.1 oz (121 kg)  03/23/20 264 lb 9.6 oz (120 kg)     Health Maintenance Due  Topic Date Due  . Hepatitis C Screening  Never done  . COVID-19 Vaccine (1) Never done  . HIV Screening  Never done  . TETANUS/TDAP  Never done  . PAP SMEAR-Modifier  Never done  . COLONOSCOPY (Pts 45-76yrs Insurance coverage will need to be confirmed)  Never done  . MAMMOGRAM  Never done  . INFLUENZA VACCINE  Never done    There are no preventive care reminders to display for this patient.  Lab Results  Component Value Date   TSH 1.330 03/23/2020   Lab Results  Component Value Date   WBC 11.0 (H) 11/10/2020   HGB 15.1 (H) 11/10/2020   HCT 46.0 11/10/2020   MCV 97.7 11/10/2020   PLT 267 11/10/2020   Lab Results  Component Value Date   NA 141 11/10/2020   K 3.7 11/10/2020   CO2 26 11/10/2020   GLUCOSE 105 (H) 11/10/2020   BUN 12 11/10/2020   CREATININE 0.86 11/10/2020   BILITOT 0.3 03/23/2020   ALKPHOS 66 03/23/2020   AST 16 03/23/2020   ALT 16 03/23/2020   PROT 7.0 03/23/2020   ALBUMIN 4.4 03/23/2020   CALCIUM 9.3 11/10/2020   ANIONGAP 9 11/10/2020   Lab Results  Component Value Date   CHOL 222 (H) 03/23/2020   Lab Results  Component Value Date   HDL 31 (L) 03/23/2020   Lab Results  Component Value Date   LDLCALC 141 (H) 03/23/2020   Lab Results  Component Value Date  TRIG 272 (H) 03/23/2020   Lab Results  Component Value Date   CHOLHDL 7.2 (H) 03/23/2020   Lab Results  Component Value Date   HGBA1C 5.5 03/23/2020    Assessment & Plan:   1. Hospital discharge follow-up  2. Chest heaviness Stable today. No signs or symptoms of respiratory distress noted or reported. Monitor.   3. Heart palpitations Stable today. Monitor.   4. Nausea  5. Lightheadedness Resolved.   6. History of MI  (myocardial infarction) Stable. Lab results are negative. Monitor.   7. Status post laser cataract surgery of both eyes  8. Essential hypertension The current medical regimen is effective; blood pressure is stable at 128/69 today; continue present plan and medications as prescribed. she will continue to take medications as prescribed, to decrease high sodium intake, excessive alcohol intake, increase potassium intake, smoking cessation, and increase physical activity of at least 30 minutes of cardio activity daily. She will continue to follow Heart Healthy or DASH diet. - furosemide (LASIX) 20 MG tablet; Take 1 tablet (20 mg total) by mouth daily.  Dispense: 90 tablet; Refill: 3 - hydrOXYzine (ATARAX/VISTARIL) 50 MG tablet; Take 1 tablet (50 mg total) by mouth 3 (three) times daily.  Dispense: 270 tablet; Refill: 3 - lisinopril (ZESTRIL) 20 MG tablet; Take 1 tablet (20 mg total) by mouth daily.  Dispense: 90 tablet; Refill: 3 - metoprolol succinate (TOPROL-XL) 50 MG 24 hr tablet; Take 1 tablet (50 mg total) by mouth daily. Take with or immediately following a meal.  Dispense: 90 tablet; Refill: 3 - nitroGLYCERIN (NITROSTAT) 0.4 MG SL tablet; Place 1 tablet (0.4 mg total) under the tongue every 5 (five) minutes as needed for chest pain.  Dispense: 100 tablet; Refill: 3  9. Anxiety  10. Bilateral sciatica - acetaminophen (TYLENOL) 500 MG tablet; Take 1 tablet (500 mg total) by mouth every 6 (six) hours as needed.  Dispense: 60 tablet; Refill: 11 - gabapentin (NEURONTIN) 300 MG capsule; Take 2 capsules (600 mg= total), 3 times a day.  Dispense: 120 capsule; Refill: 6  11. Edema, unspecified type - furosemide (LASIX) 20 MG tablet; Take 1 tablet (20 mg total) by mouth daily.  Dispense: 90 tablet; Refill: 3  12. Dizziness - meclizine (ANTIVERT) 25 MG tablet; Take 1 tablet (25 mg total) by mouth 3 (three) times daily as needed for dizziness.  Dispense: 90 tablet; Refill: 6  13. Follow up She will  follow up in 3 months.   Meds ordered this encounter  Medications  . acetaminophen (TYLENOL) 500 MG tablet    Sig: Take 1 tablet (500 mg total) by mouth every 6 (six) hours as needed.    Dispense:  60 tablet    Refill:  11  . aspirin EC 81 MG tablet    Sig: Take 1 tablet (81 mg total) by mouth daily.    Dispense:  90 tablet    Refill:  3  . furosemide (LASIX) 20 MG tablet    Sig: Take 1 tablet (20 mg total) by mouth daily.    Dispense:  90 tablet    Refill:  3  . gabapentin (NEURONTIN) 300 MG capsule    Sig: Take 2 capsules (600 mg= total), 3 times a day.    Dispense:  120 capsule    Refill:  6  . hydrOXYzine (ATARAX/VISTARIL) 50 MG tablet    Sig: Take 1 tablet (50 mg total) by mouth 3 (three) times daily.    Dispense:  270 tablet  Refill:  3  . lisinopril (ZESTRIL) 20 MG tablet    Sig: Take 1 tablet (20 mg total) by mouth daily.    Dispense:  90 tablet    Refill:  3  . meclizine (ANTIVERT) 25 MG tablet    Sig: Take 1 tablet (25 mg total) by mouth 3 (three) times daily as needed for dizziness.    Dispense:  90 tablet    Refill:  6  . metoprolol succinate (TOPROL-XL) 50 MG 24 hr tablet    Sig: Take 1 tablet (50 mg total) by mouth daily. Take with or immediately following a meal.    Dispense:  90 tablet    Refill:  3  . nitroGLYCERIN (NITROSTAT) 0.4 MG SL tablet    Sig: Place 1 tablet (0.4 mg total) under the tongue every 5 (five) minutes as needed for chest pain.    Dispense:  100 tablet    Refill:  3    Ordered  Referral Orders  No referral(s) requested today    Raliegh Ip, MSN, ANE, FNP-BC [facility] [address] [place], [place] [address] [phone] fax  Problem List Items Addressed This Visit      Nervous and Auditory   Bilateral sciatica   Relevant Medications   acetaminophen (TYLENOL) 500 MG tablet   gabapentin (NEURONTIN) 300 MG capsule    hydrOXYzine (ATARAX/VISTARIL) 50 MG tablet     Other   Dizziness   Relevant Medications   meclizine (ANTIVERT) 25 MG tablet    Other Visit Diagnoses    Hospital discharge follow-up    -  Primary   Chest heaviness       Heart palpitations       Nausea       Lightheadedness       History of MI (myocardial infarction)       Status post laser cataract surgery of both eyes       Essential hypertension       Relevant Medications   aspirin EC 81 MG tablet   furosemide (LASIX) 20 MG tablet   hydrOXYzine (ATARAX/VISTARIL) 50 MG tablet   lisinopril (ZESTRIL) 20 MG tablet   metoprolol succinate (TOPROL-XL) 50 MG 24 hr tablet   nitroGLYCERIN (NITROSTAT) 0.4 MG SL tablet   Anxiety       Relevant Medications   hydrOXYzine (ATARAX/VISTARIL) 50 MG tablet   Edema, unspecified type       Relevant Medications   furosemide (LASIX) 20 MG tablet   Follow up          Meds ordered this encounter  Medications  . acetaminophen (TYLENOL) 500 MG tablet    Sig: Take 1 tablet (500 mg total) by mouth every 6 (six) hours as needed.    Dispense:  60 tablet    Refill:  11  . aspirin EC 81 MG tablet    Sig: Take 1 tablet (81 mg total) by mouth daily.    Dispense:  90 tablet    Refill:  3  . furosemide (LASIX) 20 MG tablet    Sig: Take 1 tablet (20 mg total) by mouth daily.    Dispense:  90 tablet    Refill:  3  . gabapentin (NEURONTIN) 300 MG capsule    Sig: Take 2 capsules (600 mg= total), 3 times a day.    Dispense:  120 capsule    Refill:  6  . hydrOXYzine (ATARAX/VISTARIL) 50  MG tablet    Sig: Take 1 tablet (50 mg total) by mouth 3 (three) times daily.    Dispense:  270 tablet    Refill:  3  . lisinopril (ZESTRIL) 20 MG tablet    Sig: Take 1 tablet (20 mg total) by mouth daily.    Dispense:  90 tablet    Refill:  3  . meclizine (ANTIVERT) 25 MG tablet    Sig: Take 1 tablet (25 mg total) by mouth 3 (three) times daily as needed for dizziness.    Dispense:  90 tablet    Refill:  6  .  metoprolol succinate (TOPROL-XL) 50 MG 24 hr tablet    Sig: Take 1 tablet (50 mg total) by mouth daily. Take with or immediately following a meal.    Dispense:  90 tablet    Refill:  3  . nitroGLYCERIN (NITROSTAT) 0.4 MG SL tablet    Sig: Place 1 tablet (0.4 mg total) under the tongue every 5 (five) minutes as needed for chest pain.    Dispense:  100 tablet    Refill:  3    Follow-up: Return in about 3 months (around 02/14/2021).    Kallie Locks, FNP

## 2020-11-18 ENCOUNTER — Encounter: Payer: Self-pay | Admitting: Family Medicine

## 2021-02-13 ENCOUNTER — Encounter: Payer: Self-pay | Admitting: Family Medicine

## 2021-02-14 ENCOUNTER — Ambulatory Visit: Payer: Managed Care, Other (non HMO) | Admitting: Family Medicine

## 2021-06-20 ENCOUNTER — Telehealth: Payer: Self-pay

## 2021-06-20 ENCOUNTER — Other Ambulatory Visit: Payer: Self-pay | Admitting: Nurse Practitioner

## 2021-06-20 DIAGNOSIS — M5431 Sciatica, right side: Secondary | ICD-10-CM

## 2021-06-20 DIAGNOSIS — M5432 Sciatica, left side: Secondary | ICD-10-CM

## 2021-06-20 MED ORDER — GABAPENTIN 300 MG PO CAPS
ORAL_CAPSULE | ORAL | 0 refills | Status: DC
Start: 2021-06-20 — End: 2021-08-03

## 2021-06-20 NOTE — Telephone Encounter (Signed)
gabapentin

## 2021-06-21 ENCOUNTER — Encounter: Payer: Self-pay | Admitting: Family Medicine

## 2021-08-02 ENCOUNTER — Telehealth: Payer: Self-pay | Admitting: Nurse Practitioner

## 2021-08-02 NOTE — Telephone Encounter (Signed)
Patient's husband called requesting refill on Gabapentin. Patient has 3 days of med remaining and has appt to establish care with Ricky Stabs at [facility] on 9/22 and needs meds to get her through to that appt.  [phone]

## 2021-08-03 ENCOUNTER — Other Ambulatory Visit: Payer: Self-pay | Admitting: Nurse Practitioner

## 2021-08-03 DIAGNOSIS — M5432 Sciatica, left side: Secondary | ICD-10-CM

## 2021-08-03 DIAGNOSIS — M5431 Sciatica, right side: Secondary | ICD-10-CM

## 2021-08-03 MED ORDER — GABAPENTIN 300 MG PO CAPS: ORAL_CAPSULE | ORAL | 0 refills | Status: DC

## 2021-08-04 ENCOUNTER — Other Ambulatory Visit: Payer: Self-pay

## 2021-08-04 DIAGNOSIS — M5432 Sciatica, left side: Secondary | ICD-10-CM

## 2021-08-04 DIAGNOSIS — M5431 Sciatica, right side: Secondary | ICD-10-CM

## 2021-08-04 MED ORDER — GABAPENTIN 300 MG PO CAPS: ORAL_CAPSULE | ORAL | 0 refills | Status: DC

## 2021-08-07 NOTE — Progress Notes (Signed)
Subjective:    Deanna Dudley - 65 y.o. female MRN [id number]  Date of birth: [birth year]  HPI  Deanna Dudley is to establish care.  Current issues and/or concerns: None. Requesting refills of Gabapentin for muscle spasms. Reports she has refills on all other prescriptions.   ROS per HPI    Health Maintenance:  Health Maintenance Due  Topic Date Due   COVID-19 Vaccine (1) Never done   HIV Screening  Never done   Hepatitis C Screening  Never done   TETANUS/TDAP  Never done   PAP SMEAR-Modifier  Never done   COLONOSCOPY (Pts 45-72yrs Insurance coverage will need to be confirmed)  Never done   MAMMOGRAM  Never done   Zoster Vaccines- Shingrix (1 of 2) Never done   INFLUENZA VACCINE  Never done     Past Medical History: Patient Active Problem List   Diagnosis Date Noted   Nonintractable headache 03/25/2020   Bilateral sciatica 03/13/2019   Dizziness 03/13/2019   Angina pectoris (HCC)    Paresthesia 02/05/2018   Hypertension    Muscle spasm    Myocardial infarction (HCC) 11/20/2009    Social History   reports that she has been smoking cigarettes. She has never used smokeless tobacco. She reports current alcohol use of about 1.0 standard drink per week. She reports that she does not use drugs.   Family History  family history includes CAD in her father; Clotting disorder in her maternal grandfather; Colon cancer in her mother; Diabetes in her father; Heart disease in her maternal grandfather and paternal grandmother; Hypothyroidism in her father; Lung cancer in her mother; Stroke in her mother.   Medications: reviewed and updated   Objective:   Physical Exam BP (!) 142/84 (BP Location: Left Arm, Patient Position: Sitting, Cuff Size: Large)   Pulse 71   Temp 98.6 F (37 C)   Resp 16   Ht 5\' 5"  (1.651 m)   Wt 268 lb (121.6 kg)   SpO2 97%   BMI 44.60 kg/m    Physical Exam HENT:     Head: Normocephalic and atraumatic.     Right Ear: Tympanic membrane, ear  canal and external ear normal.     Left Ear: Tympanic membrane, ear canal and external ear normal.  Eyes:     Extraocular Movements: Extraocular movements intact.     Conjunctiva/sclera: Conjunctivae normal.     Pupils: Pupils are equal, round, and reactive to light.  Cardiovascular:     Rate and Rhythm: Normal rate and regular rhythm.     Pulses: Normal pulses.     Heart sounds: Normal heart sounds.  Pulmonary:     Effort: Pulmonary effort is normal.     Breath sounds: Normal breath sounds.  Musculoskeletal:     Cervical back: Normal range of motion and neck supple.  Neurological:     General: No focal deficit present.     Mental Status: She is alert and oriented to person, place, and time.  Psychiatric:        Mood and Affect: Mood normal.        Behavior: Behavior normal.     Assessment & Plan:  1. Encounter to establish care: - Patient presents today to establish care.  - Return for annual physical examination, labs, and health maintenance. Arrive fasting meaning having no food for at least 8 hours prior to appointment. You may have only water or black coffee. Please take scheduled medications as normal.  2. Bilateral sciatica: 3.  Muscle spasm: - Continue Gabapentin as prescribed.  - Follow-up with primary provider as scheduled.  - gabapentin (NEURONTIN) 300 MG capsule; Take 2 tablets (600 mg total) by mouth every morning and 3 tablets (900 mg total) by mouth every evening.  Dispense: 150 capsule; Refill: 1     Patient was given clear instructions to go to Emergency Department or return to medical center if symptoms don't improve, worsen, or new problems develop.The patient verbalized understanding.  I discussed the assessment and treatment plan with the patient. The patient was provided an opportunity to ask questions and all were answered. The patient agreed with the plan and demonstrated an understanding of the instructions.   The patient was advised to call back or  seek an in-person evaluation if the symptoms worsen or if the condition fails to improve as anticipated.    Ricky Stabs, NP 08/11/2021, 11:03 AM Primary Care at [facility]

## 2021-08-11 ENCOUNTER — Ambulatory Visit (INDEPENDENT_AMBULATORY_CARE_PROVIDER_SITE_OTHER): Payer: Self-pay | Admitting: Family

## 2021-08-11 ENCOUNTER — Encounter: Payer: Self-pay | Admitting: Family

## 2021-08-11 ENCOUNTER — Other Ambulatory Visit: Payer: Self-pay

## 2021-08-11 VITALS — BP 142/84 | HR 71 | Temp 98.6°F | Resp 16 | Ht 65.0 in | Wt 268.0 lb

## 2021-08-11 DIAGNOSIS — M5431 Sciatica, right side: Secondary | ICD-10-CM

## 2021-08-11 DIAGNOSIS — M5432 Sciatica, left side: Secondary | ICD-10-CM | POA: Diagnosis not present

## 2021-08-11 DIAGNOSIS — M62838 Other muscle spasm: Secondary | ICD-10-CM

## 2021-08-11 DIAGNOSIS — Z7689 Persons encountering health services in other specified circumstances: Secondary | ICD-10-CM

## 2021-08-11 MED ORDER — GABAPENTIN 300 MG PO CAPS: ORAL_CAPSULE | ORAL | 1 refills | Status: DC

## 2021-08-11 NOTE — Patient Instructions (Signed)

## 2021-08-11 NOTE — Progress Notes (Signed)
Pt presents to establish care, pt need gabapentin refill which needs to indicate 2 capsules in morning 3 capsules at bedtime

## 2021-08-18 ENCOUNTER — Ambulatory Visit: Payer: Self-pay | Admitting: Nurse Practitioner

## 2021-08-20 NOTE — Progress Notes (Signed)
Patient ID: Deanna Dudley, female    DOB: [birth year]  MRN: [id number]  CC: Annual Physical Exam  Subjective: Deanna Dudley is a 65 y.o. female who presents for annual physical exam.   Her concerns today include:  None.  Patient Active Problem List   Diagnosis Date Noted   Nonintractable headache 03/25/2020   Bilateral sciatica 03/13/2019   Dizziness 03/13/2019   Angina pectoris ([place])    Paresthesia 02/05/2018   Hypertension    Muscle spasm    Myocardial infarction ([place]) 11/20/2009     Current Outpatient Medications on File Prior to Visit  Medication Sig Dispense Refill   acetaminophen (TYLENOL) 500 MG tablet Take 1 tablet (500 mg total) by mouth every 6 (six) hours as needed. 60 tablet 11   aspirin EC 81 MG tablet Take 1 tablet (81 mg total) by mouth daily. 90 tablet 3   furosemide (LASIX) 20 MG tablet Take 1 tablet (20 mg total) by mouth daily. 90 tablet 3   gabapentin (NEURONTIN) 300 MG capsule Take 2 tablets (600 mg total) by mouth every morning and 3 tablets (900 mg total) by mouth every evening. 150 capsule 1   hydrOXYzine (ATARAX/VISTARIL) 50 MG tablet Take 1 tablet (50 mg total) by mouth 3 (three) times daily as needed. 30 tablet 0   lisinopril (ZESTRIL) 20 MG tablet Take 1 tablet (20 mg total) by mouth daily. 90 tablet 3   meclizine (ANTIVERT) 25 MG tablet Take 1 tablet (25 mg total) by mouth 3 (three) times daily as needed for dizziness. 90 tablet 6   metoprolol succinate (TOPROL-XL) 50 MG 24 hr tablet Take 1 tablet (50 mg total) by mouth daily. Take with or immediately following a meal. 90 tablet 3   Multiple Vitamins-Minerals (MULTIVITAMIN WITH MINERALS) tablet Take 1 tablet by mouth daily.     nitroGLYCERIN (NITROSTAT) 0.4 MG SL tablet Place 1 tablet (0.4 mg total) under the tongue every 5 (five) minutes as needed for chest pain. 100 tablet 3   No current facility-administered medications on file prior to visit.    Allergies  Allergen Reactions   Morphine And  Related Hives and Shortness Of Breath   Statins Other (See Comments)    Muscle cramps   Xanax [Alprazolam]     Over sedated, possible sleep walking    Demerol [Meperidine]     Migraines    Imitrex [Sumatriptan]    Morphine And Related    Other     [place] Nuts - tongue swelling, mouth itching     Social History   Socioeconomic History   Marital status: Married    Spouse name: Not on file   Number of children: Not on file   Years of education: Not on file   Highest education level: Not on file  Occupational History   Not on file  Tobacco Use   Smoking status: Every Day    Types: Cigarettes   Smokeless tobacco: Never  Vaping Use   Vaping Use: Never used  Substance and Sexual Activity   Alcohol use: Yes    Alcohol/week: 1.0 standard drink    Types: 1 Cans of beer per week   Drug use: Never   Sexual activity: Yes  Other Topics Concern   Not on file  Social History Narrative   ** Merged History Encounter **       Social Determinants of Health   Financial Resource Strain: Not on file  Food Insecurity: Not on file  Transportation Needs:  Not on file  Physical Activity: Not on file  Stress: Not on file  Social Connections: Not on file  Intimate Partner Violence: Not on file    Family History  Problem Relation Age of Onset   Stroke Mother    Colon cancer Mother    Lung cancer Mother    Hypothyroidism Father    Diabetes Father    CAD Father    Heart disease Maternal Grandfather    Clotting disorder Maternal Grandfather    Heart disease Paternal Grandmother     Past Surgical History:  Procedure Laterality Date   ABDOMINAL HYSTERECTOMY  1983   APPENDECTOMY     Age 58   BREAST SURGERY Bilateral    Removed ducts from both breast   CHOLECYSTECTOMY  1995   RIGHT/LEFT HEART CATH AND CORONARY ANGIOGRAPHY N/A 02/06/2018   Procedure: RIGHT/LEFT HEART CATH AND CORONARY ANGIOGRAPHY;  Surgeon: Jettie Booze, MD;  Location: [facility];  Service:  Cardiovascular;  Laterality: N/A;   SHOULDER SURGERY Right    Rotater cuff repair   TONSILLECTOMY     Age 42    ROS: Review of Systems Negative except as stated above  PHYSICAL EXAM: BP 132/84 (BP Location: Right Arm, Patient Position: Sitting, Cuff Size: Large)   Pulse 69   Temp 97.8 F (36.6 C) (Oral)   Ht _0  (1.651 m)   Wt 263 lb 6.4 oz (119.5 kg)   SpO2 95%   BMI 43.83 kg/m   Physical Exam HENT:     Head: Normocephalic and atraumatic.     Right Ear: Tympanic membrane, ear canal and external ear normal.     Left Ear: Tympanic membrane, ear canal and external ear normal.     Nose: Nose normal.     Mouth/Throat:     Mouth: Mucous membranes are moist.     Pharynx: Oropharynx is clear.  Eyes:     Extraocular Movements: Extraocular movements intact.     Conjunctiva/sclera: Conjunctivae normal.     Pupils: Pupils are equal, round, and reactive to light.  Cardiovascular:     Rate and Rhythm: Normal rate and regular rhythm.     Pulses: Normal pulses.     Heart sounds: Normal heart sounds.  Pulmonary:     Effort: Pulmonary effort is normal.     Breath sounds: Normal breath sounds.  Chest:     Comments: Patient declined exam.  Abdominal:     General: Bowel sounds are normal.     Palpations: Abdomen is soft.  Genitourinary:    Comments: Patient declined exam.  Musculoskeletal:        General: Normal range of motion.     Cervical back: Normal range of motion and neck supple.  Skin:    General: Skin is warm and dry.     Capillary Refill: Capillary refill takes less than 2 seconds.  Neurological:     General: No focal deficit present.     Mental Status: She is alert and oriented to person, place, and time.  Psychiatric:        Mood and Affect: Mood normal.        Behavior: Behavior normal.     ASSESSMENT AND PLAN: 1. Annual physical exam: - Counseled on 150 minutes of exercise per week as tolerated, healthy eating (including decreased daily intake of  saturated fats, cholesterol, added sugars, sodium), STI prevention, and routine healthcare maintenance.  2. Screening for metabolic disorder: - XYI01+KPVV to check kidney  function, liver function, and electrolyte balance.  - CMP14+EGFR  3. Screening for deficiency anemia: - CBC to screen for anemia. - CBC  4. Diabetes mellitus screening: - Hemoglobin A1c to screen for pre-diabetes/diabetes. - Hemoglobin A1c  5. Screening cholesterol level: - Lipid panel to screen for high cholesterol.  - Lipid panel  6. Thyroid disorder screen: - TSH to check thyroid function.  - TSH  7. Need for hepatitis C screening test: - Hepatitis C antibody to screen for hepatitis C.  - Hepatitis C Antibody  8. Encounter for screening mammogram for malignant neoplasm of breast: - Patient declined. Encouraged to schedule soon.  9. Pap smear for cervical cancer screening: 10. Routine screening for STI (sexually transmitted infection): - Patient declined. Encouraged to schedule soon.   11. Encounter for screening for HIV: - HIV antibody to screen for human immunodeficiency virus.  - HIV antibody (with reflex)  12. Colon cancer screening: - Patient declined colonoscopy.  - Per patient preference Cologuard for further evaluation.  - Cologuard    Patient was given the opportunity to ask questions.  Patient verbalized understanding of the plan and was able to repeat key elements of the plan. Patient was given clear instructions to go to Emergency Department or return to medical center if symptoms don't improve, worsen, or new problems develop.The patient verbalized understanding.   Orders Placed This Encounter  Procedures   HIV antibody (with reflex)   Hepatitis C Antibody   CBC   Lipid panel   TSH   CMP14+EGFR   Hemoglobin A1c   Cologuard    Return in about 1 year (around 08/24/2022) for Physical per patient preference.  Camillia Herter, NP

## 2021-08-24 ENCOUNTER — Encounter: Payer: Self-pay | Admitting: Family

## 2021-08-24 ENCOUNTER — Other Ambulatory Visit: Payer: Self-pay

## 2021-08-24 ENCOUNTER — Ambulatory Visit (INDEPENDENT_AMBULATORY_CARE_PROVIDER_SITE_OTHER): Payer: Self-pay | Admitting: Family

## 2021-08-24 VITALS — BP 132/84 | HR 69 | Temp 97.8°F | Ht 65.0 in | Wt 263.4 lb

## 2021-08-24 DIAGNOSIS — Z1159 Encounter for screening for other viral diseases: Secondary | ICD-10-CM

## 2021-08-24 DIAGNOSIS — Z131 Encounter for screening for diabetes mellitus: Secondary | ICD-10-CM

## 2021-08-24 DIAGNOSIS — Z130 Encounter for screening for diseases of the blood and blood-forming organs and certain disorders involving the immune mechanism: Secondary | ICD-10-CM | POA: Diagnosis not present

## 2021-08-24 DIAGNOSIS — Z1211 Encounter for screening for malignant neoplasm of colon: Secondary | ICD-10-CM

## 2021-08-24 DIAGNOSIS — Z113 Encounter for screening for infections with a predominantly sexual mode of transmission: Secondary | ICD-10-CM

## 2021-08-24 DIAGNOSIS — Z114 Encounter for screening for human immunodeficiency virus [HIV]: Secondary | ICD-10-CM

## 2021-08-24 DIAGNOSIS — Z0000 Encounter for general adult medical examination without abnormal findings: Secondary | ICD-10-CM | POA: Diagnosis not present

## 2021-08-24 DIAGNOSIS — Z1329 Encounter for screening for other suspected endocrine disorder: Secondary | ICD-10-CM

## 2021-08-24 DIAGNOSIS — Z124 Encounter for screening for malignant neoplasm of cervix: Secondary | ICD-10-CM

## 2021-08-24 DIAGNOSIS — Z13220 Encounter for screening for lipoid disorders: Secondary | ICD-10-CM

## 2021-08-24 DIAGNOSIS — Z1231 Encounter for screening mammogram for malignant neoplasm of breast: Secondary | ICD-10-CM

## 2021-08-24 DIAGNOSIS — Z13228 Encounter for screening for other metabolic disorders: Secondary | ICD-10-CM | POA: Diagnosis not present

## 2021-08-24 NOTE — Patient Instructions (Signed)
Preventive Care 40-64 Years Old, Female Preventive care refers to lifestyle choices and visits with your health care provider that can promote health and wellness. This includes: A yearly physical exam. This is also called an annual wellness visit. Regular dental and eye exams. Immunizations. Screening for certain conditions. Healthy lifestyle choices, such as: Eating a healthy diet. Getting regular exercise. Not using drugs or products that contain nicotine and tobacco. Limiting alcohol use. What can I expect for my preventive care visit? Physical exam Your health care provider will check your: Height and weight. These may be used to calculate your BMI (body mass index). BMI is a measurement that tells if you are at a healthy weight. Heart rate and blood pressure. Body temperature. Skin for abnormal spots. Counseling Your health care provider may ask you questions about your: Past medical problems. Family's medical history. Alcohol, tobacco, and drug use. Emotional well-being. Home life and relationship well-being. Sexual activity. Diet, exercise, and sleep habits. Work and work environment. Access to firearms. Method of birth control. Menstrual cycle. Pregnancy history. What immunizations do I need? Vaccines are usually given at various ages, according to a schedule. Your health care provider will recommend vaccines for you based on your age, medical history, and lifestyle or other factors, such as travel or where you work. What tests do I need? Blood tests Lipid and cholesterol levels. These may be checked every 5 years, or more often if you are over 50 years old. Hepatitis C test. Hepatitis B test. Screening Lung cancer screening. You may have this screening every year starting at age 55 if you have a 30-pack-year history of smoking and currently smoke or have quit within the past 15 years. Colorectal cancer screening. All adults should have this screening starting at  age 50 and continuing until age 75. Your health care provider may recommend screening at age 45 if you are at increased risk. You will have tests every 1-10 years, depending on your results and the type of screening test. Diabetes screening. This is done by checking your blood sugar (glucose) after you have not eaten for a while (fasting). You may have this done every 1-3 years. Mammogram. This may be done every 1-2 years. Talk with your health care provider about when you should start having regular mammograms. This may depend on whether you have a family history of breast cancer. BRCA-related cancer screening. This may be done if you have a family history of breast, ovarian, tubal, or peritoneal cancers. Pelvic exam and Pap test. This may be done every 3 years starting at age 21. Starting at age 30, this may be done every 5 years if you have a Pap test in combination with an HPV test. Other tests STD (sexually transmitted disease) testing, if you are at risk. Bone density scan. This is done to screen for osteoporosis. You may have this scan if you are at high risk for osteoporosis. Talk with your health care provider about your test results, treatment options, and if necessary, the need for more tests. Follow these instructions at home: Eating and drinking  Eat a diet that includes fresh fruits and vegetables, whole grains, lean protein, and low-fat dairy products. Take vitamin and mineral supplements as recommended by your health care provider. Do not drink alcohol if: Your health care provider tells you not to drink. You are pregnant, may be pregnant, or are planning to become pregnant. If you drink alcohol: Limit how much you have to 0-1 drink a day. Be   aware of how much alcohol is in your drink. In the [place] one drink equals one 12 oz bottle of beer (355 mL), one 5 oz glass of wine (148 mL), or one 1 oz glass of hard liquor (44 mL). Lifestyle Take daily care of your teeth and  gums. Brush your teeth every morning and night with fluoride toothpaste. Floss one time each day. Stay active. Exercise for at least 30 minutes 5 or more days each week. Do not use any products that contain nicotine or tobacco, such as cigarettes, e-cigarettes, and chewing tobacco. If you need help quitting, ask your health care provider. Do not use drugs. If you are sexually active, practice safe sex. Use a condom or other form of protection to prevent STIs (sexually transmitted infections). If you do not wish to become pregnant, use a form of birth control. If you plan to become pregnant, see your health care provider for a prepregnancy visit. If told by your health care provider, take low-dose aspirin daily starting at age 63. Find healthy ways to cope with stress, such as: Meditation, yoga, or listening to music. Journaling. Talking to a trusted person. Spending time with friends and family. Safety Always wear your seat belt while driving or riding in a vehicle. Do not drive: If you have been drinking alcohol. Do not ride with someone who has been drinking. When you are tired or distracted. While texting. Wear a helmet and other protective equipment during sports activities. If you have firearms in your house, make sure you follow all gun safety procedures. What's next? Visit your health care provider once a year for an annual wellness visit. Ask your health care provider how often you should have your eyes and teeth checked. Stay up to date on all vaccines. This information is not intended to replace advice given to you by your health care provider. Make sure you discuss any questions you have with your health care provider. Document Revised: 01/14/2021 Document Reviewed: 07/18/2018 Elsevier Patient Education  2022 [employer].

## 2021-08-25 ENCOUNTER — Encounter: Payer: Self-pay | Admitting: Family

## 2021-08-25 ENCOUNTER — Other Ambulatory Visit: Payer: Self-pay | Admitting: Family

## 2021-08-25 DIAGNOSIS — I10 Essential (primary) hypertension: Secondary | ICD-10-CM

## 2021-08-25 DIAGNOSIS — Z888 Allergy status to other drugs, medicaments and biological substances status: Secondary | ICD-10-CM

## 2021-08-25 DIAGNOSIS — E785 Hyperlipidemia, unspecified: Secondary | ICD-10-CM | POA: Insufficient documentation

## 2021-08-25 DIAGNOSIS — R7303 Prediabetes: Secondary | ICD-10-CM

## 2021-08-25 LAB — CBC
Hematocrit: 43.4 % (ref 34.0–46.6)
Hemoglobin: 14.9 g/dL (ref 11.1–15.9)
MCH: 31.3 pg (ref 26.6–33.0)
MCHC: 34.3 g/dL (ref 31.5–35.7)
MCV: 91 fL (ref 79–97)
Platelets: 252 10*3/uL (ref 150–450)
RBC: 4.76 x10E6/uL (ref 3.77–5.28)
RDW: 13 % (ref 11.7–15.4)
WBC: 9.8 10*3/uL (ref 3.4–10.8)

## 2021-08-25 LAB — CMP14+EGFR
ALT: 16 IU/L (ref 0–32)
AST: 17 IU/L (ref 0–40)
Albumin/Globulin Ratio: 1.5 (ref 1.2–2.2)
Albumin: 4.3 g/dL (ref 3.8–4.8)
Alkaline Phosphatase: 65 IU/L (ref 44–121)
BUN/Creatinine Ratio: 20 (ref 12–28)
BUN: 17 mg/dL (ref 8–27)
Bilirubin Total: 0.3 mg/dL (ref 0.0–1.2)
CO2: 20 mmol/L (ref 20–29)
Calcium: 9.4 mg/dL (ref 8.7–10.3)
Chloride: 103 mmol/L (ref 96–106)
Creatinine, Ser: 0.85 mg/dL (ref 0.57–1.00)
Globulin, Total: 2.8 g/dL (ref 1.5–4.5)
Glucose: 87 mg/dL (ref 70–99)
Potassium: 4.6 mmol/L (ref 3.5–5.2)
Sodium: 141 mmol/L (ref 134–144)
Total Protein: 7.1 g/dL (ref 6.0–8.5)
eGFR: 76 mL/min/{1.73_m2} (ref >59)

## 2021-08-25 LAB — LIPID PANEL
Chol/HDL Ratio: 7.5 ratio — ABNORMAL HIGH (ref 0.0–4.4)
Cholesterol, Total: 224 mg/dL — ABNORMAL HIGH (ref 100–199)
HDL: 30 mg/dL — ABNORMAL LOW (ref >39)
LDL Chol Calc (NIH): 142 mg/dL — ABNORMAL HIGH (ref 0–99)
Triglycerides: 283 mg/dL — ABNORMAL HIGH (ref 0–149)
VLDL Cholesterol Cal: 52 mg/dL — ABNORMAL HIGH (ref 5–40)

## 2021-08-25 LAB — HIV ANTIBODY (ROUTINE TESTING W REFLEX): HIV Screen 4th Generation wRfx: NONREACTIVE

## 2021-08-25 LAB — TSH: TSH: 1.08 u[IU]/mL (ref 0.450–4.500)

## 2021-08-25 LAB — HEMOGLOBIN A1C
Est. average glucose Bld gHb Est-mCnc: 117 mg/dL
Hgb A1c MFr Bld: 5.7 % — ABNORMAL HIGH (ref 4.8–5.6)

## 2021-08-25 LAB — HEPATITIS C ANTIBODY: Hep C Virus Ab: <0.1 s/co ratio (ref 0.0–0.9)

## 2021-08-25 MED ORDER — LISINOPRIL 20 MG PO TABS: 20.0000 mg | ORAL_TABLET | Freq: Every day | ORAL | 0 refills | Status: DC

## 2021-08-25 MED ORDER — METOPROLOL SUCCINATE ER 50 MG PO TB24: 50.0000 mg | ORAL_TABLET | Freq: Every day | ORAL | 0 refills | Status: DC

## 2021-08-25 NOTE — Progress Notes (Signed)
Kidney function normal.   Liver function normal.   Thyroid function normal.   No anemia.   Hepatitis C negative.   HIV negative.   Hemoglobin A1c is consistent with pre-diabetes. Practice healthy eating habits of fresh fruit and vegetables, lean baked meats such as chicken, fish, and [place]; limit breads, rice, pastas, and desserts; practice regular aerobic exercise (at least 150 minutes a week as tolerated). No medication needed at the moment. Encouraged to recheck in 6 months.   Cholesterol higher than expected. High cholesterol may increase risk of heart attack and/or stroke. Consider eating more fruits, vegetables, and lean baked meats such as chicken or fish. Moderate intensity exercise at least 150 minutes as tolerated per week may help as well.   Patient has allergy to Statins. Therefore, referral placed to [facility] for further evaluation and management. Their office should call patient within 2 weeks with appointment details.

## 2021-09-27 ENCOUNTER — Other Ambulatory Visit: Payer: Self-pay | Admitting: Family

## 2021-09-27 ENCOUNTER — Encounter: Payer: Self-pay | Admitting: Family

## 2021-09-27 DIAGNOSIS — I700 Atherosclerosis of aorta: Secondary | ICD-10-CM

## 2021-09-30 NOTE — Progress Notes (Signed)
Cardiology Office Note:    Date:  10/03/2021   ID:  Deanna Dudley, DOB [birth year], MRN [id number]  PCP:  Kallie Locks, FNP (Inactive)  Cardiologist:  Orbie Pyo, MD  Electrophysiologist:  None   Referring MD: Rema Fendt, NP   Chief Complaint/Reason for Referral: Establish cardiovascular care  History of Present Illness:    PROBLEM LIST: 1.  Mild obstructive coronary artery disease on cardiac catheterization 2019 2.  Hypertension 3.  Hyperlipidemia; intolerant of statins 4.  Ongoing tobacco abuse   Deanna Dudley is a 65 y.o. female with the indicated history here to establish cardiovascular care.  She was seen by her primary care provider recently.  She has a history of unstable angina in 2019 and underwent cardiac catheterization which demonstrated mild obstructive coronary artery disease.  She was recently seen in urgent care and imaging demonstrated aortic calcification.  This is what resulted in her visit today as well as the need to reestablish cardiovascular care.  In terms of symptoms she occasionally gets palpitations when sitting watching TV.  This happens maybe once or twice a week.  Will last for few minutes and go away.  She occasionally gets chest heaviness but this does not happen reliably when she exerts herself.  She will sometimes get an abdominal tightness when she is sitting that lasts for minutes.  Then it goes away on its own.  She denies any paroxysmal nocturnal dyspnea, peripheral edema, signs or symptoms of stroke, severe bleeding, or worsening exertional dyspnea.  She continues to smoke and has no real plans to quit.  Past Medical History:  Diagnosis Date   CAD (coronary artery disease), native coronary artery    s/p Angioplast only in 2011 @ [place]   Headache    Hyperlipidemia LDL goal <70    Hypertension    Hypertriglyceridemia 03/2020   Hypolipidemia 03/2020   Muscle spasm    Myocardial infarction [facility]) 2011   Syncope    Tobacco abuse     Vitamin A deficiency     Past Surgical History:  Procedure Laterality Date   ABDOMINAL HYSTERECTOMY  1983   APPENDECTOMY     Age 86   BREAST SURGERY Bilateral    Removed ducts from both breast   CHOLECYSTECTOMY  1995   RIGHT/LEFT HEART CATH AND CORONARY ANGIOGRAPHY N/A 02/06/2018   Procedure: RIGHT/LEFT HEART CATH AND CORONARY ANGIOGRAPHY;  Surgeon: Corky Crafts, MD;  Location: [facility] INVASIVE [facility];  Service: Cardiovascular;  Laterality: N/A;   SHOULDER SURGERY Right    Rotater cuff repair   TONSILLECTOMY     Age 46    Current Medications: Current Meds  Medication Sig   acetaminophen (TYLENOL) 500 MG tablet Take 1 tablet (500 mg total) by mouth every 6 (six) hours as needed.   aspirin EC 81 MG tablet Take 1 tablet (81 mg total) by mouth daily.   ezetimibe (ZETIA) 10 MG tablet Take 1 tablet (10 mg total) by mouth daily.   furosemide (LASIX) 20 MG tablet Take 20 mg by mouth as needed.   gabapentin (NEURONTIN) 300 MG capsule Take 2 tablets (600 mg total) by mouth every morning and 3 tablets (900 mg total) by mouth every evening.   hydrOXYzine (VISTARIL) 50 MG capsule Take 50 mg by mouth at bedtime.   lisinopril (ZESTRIL) 20 MG tablet Take 1 tablet (20 mg total) by mouth daily.   meclizine (ANTIVERT) 25 MG tablet Take 1 tablet (25 mg total) by mouth 3 (three)  times daily as needed for dizziness.   metoprolol succinate (TOPROL-XL) 50 MG 24 hr tablet Take 1 tablet (50 mg total) by mouth daily. Take with or immediately following a meal.   metoprolol tartrate (LOPRESSOR) 100 MG tablet Take 1 tablet (100 mg total) by mouth once for 1 dose. Take 90-120 minutes prior to scan.     Allergies:    Allergies  Allergen Reactions   Morphine And Related Hives and Shortness Of Breath   Statins Other (See Comments)    Muscle cramps   Xanax [Alprazolam]     Over sedated, possible sleep walking    Demerol [Meperidine]     Migraines    Imitrex [Sumatriptan]    Morphine And Related     Other     [place] Nuts - tongue swelling, mouth itching     Social History   Tobacco Use   Smoking status: Every Day    Types: Cigarettes   Smokeless tobacco: Never  Vaping Use   Vaping Use: Never used  Substance Use Topics   Alcohol use: Yes    Alcohol/week: 1.0 standard drink    Types: 1 Cans of beer per week   Drug use: Never     Family History: Family History  Problem Relation Age of Onset   Stroke Mother    Colon cancer Mother    Lung cancer Mother    Hypothyroidism Father    Diabetes Father    CAD Father    Heart disease Maternal Grandfather    Clotting disorder Maternal Grandfather    Heart disease Paternal Grandmother      ROS:   Please see the history of present illness.    All other systems reviewed and are negative.  EKGs/Labs/Other Studies Reviewed:    The following studies were reviewed today:  EKG: EKG from 2021 demonstrates normal sinus rhythm with inferior infarction pattern.  EKG today: Normal sinus rhythm  Imaging studies that I have independently reviewed today:  Cardiac catheterization from 2019 demonstrated mild obstructive coronary artery disease with 25% stenoses of the mid LAD, proximal left circumflex, distal right coronary artery, and proximal right coronary artery  Recent Labs: 08/24/2021: ALT 16; BUN 17; Creatinine, Ser 0.85; Hemoglobin 14.9; Platelets 252; Potassium 4.6; Sodium 141; TSH 1.080  Recent Lipid Panel    Component Value Date/Time   CHOL 224 (H) 08/24/2021 1512   TRIG 283 (H) 08/24/2021 1512   HDL 30 (L) 08/24/2021 1512   CHOLHDL 7.5 (H) 08/24/2021 1512   LDLCALC 142 (H) 08/24/2021 1512    Physical Exam:    VS:  BP 132/80   Pulse 69   Ht 5\' 5"  (1.651 m)   Wt 270 lb (122.5 kg)   SpO2 93%   BMI 44.93 kg/m     Wt Readings from Last 5 Encounters:  10/03/21 270 lb (122.5 kg)  08/24/21 263 lb 6.4 oz (119.5 kg)  08/11/21 268 lb (121.6 kg)  11/16/20 256 lb 9.6 oz (116.4 kg)  11/10/20 266 lb 12.1 oz (121 kg)     GENERAL:  No apparent distress, Aox3, obese HEENT:  No carotid bruits, +2 carotid impulses, no scleral icterus CAR: RRR no murmurs, gallops, rubs, or thrills RES:  Clear to auscultation bilaterally ABD:  Soft, nontender, nondistended, positive bowel sounds x 4 VASC:  +2 radial pulses, +2 carotid pulses, palpable pedal pulses NEURO:  CN 2-12 grossly intact; motor and sensory grossly intact PSYCH:  No active depression or anxiety EXT:  No edema, ecchymosis,  or cyanosis  ASSESSMENT:    1. Mild CAD   2. Hyperlipidemia, unspecified hyperlipidemia type   3. Hypertension, unspecified type   4. Tobacco abuse   5. Precordial pain   6. Pre-procedure lab exam    PLAN:    Mild CAD Continue treatment with beta-blocker, aspirin, and as needed nitroglycerin.  The patient is intolerant of statins and therefore I will start Zetia 10 mg and have her see the pharmacy division for lipid management.  I will see her back in 3 months or earlier if needed.  Hyperlipidemia, unspecified hyperlipidemia type Start Zetia.  She may be a candidate for PCSK9 inhibitor in the future.  I will have her see the pharmacy division.  Hypertension, unspecified type Blood pressure is at goal.  Tobacco abuse I have asked her to consider quitting.  Her husband continues to smoke.  She will follow up on this with her PCP.  Precordial pain Will obtain coronary CTA and echocardiogram to evaluate further.    Shared Decision Making/Informed Consent:       Medication Adjustments/Labs and Tests Ordered: Current medicines are reviewed at length with the patient today.  Concerns regarding medicines are outlined above.   Orders Placed This Encounter  Procedures   CT CORONARY MORPH W/CTA COR W/SCORE W/CA W/CM &/OR WO/CM   Basic metabolic panel   AMB Referral to Heartcare Pharm-D   LONG TERM MONITOR (3-14 DAYS)   EKG 12-Lead   ECHOCARDIOGRAM COMPLETE     Meds ordered this encounter  Medications   metoprolol  tartrate (LOPRESSOR) 100 MG tablet    Sig: Take 1 tablet (100 mg total) by mouth once for 1 dose. Take 90-120 minutes prior to scan.    Dispense:  1 tablet    Refill:  0   ezetimibe (ZETIA) 10 MG tablet    Sig: Take 1 tablet (10 mg total) by mouth daily.    Dispense:  90 tablet    Refill:  3     Patient Instructions  Medication Instructions:  Your physician has recommended you make the following change in your medication:  1.) ezetimibe (Zetia) 10 mg - one tablet daily  *If you need a refill on your cardiac medications before your next appointment, please call your pharmacy*   Lab Work: Today: BMET  If you have labs (blood work) drawn today and your tests are completely normal, you will receive your results only by: MyChart Message (if you have MyChart) OR A paper copy in the mail If you have any lab test that is abnormal or we need to change your treatment, we will call you to review the results.   Testing/Procedures: Cardiac CT - see instructions below.  Your physician has requested that you have an echocardiogram. Echocardiography is a painless test that uses sound waves to create images of your heart. It provides your doctor with information about the size and shape of your heart and how well your heart's chambers and valves are working. This procedure takes approximately one hour. There are no restrictions for this procedure.  Zio XT monitor - 3 days - see instructions below.   Follow-Up: You have been referred to PharmD for lipid management  At [facility], you and your health needs are our priority.  As part of our continuing mission to provide you with exceptional heart care, we have created designated Provider Care Teams.  These Care Teams include your primary Cardiologist (physician) and Advanced Practice Providers (APPs -  Physician Assistants and  Nurse Practitioners) who all work together to provide you with the care you need, when you need it.  Your next  appointment:   3 month(s)  The format for your next appointment:   In Person  Provider:   Orbie Pyo, MD     Other Instructions   Your cardiac CT will be scheduled at one of the below locations:   [facility] [address] [place], [place] [address] [phone]   Please arrive at the [facility] main entrance (entrance A) of [facility] 30 minutes prior to test start time. You can use the FREE valet parking offered at the main entrance (encouraged to control the heart rate for the test) Proceed to the [facility] Radiology Department (first floor) to check-in and test prep.  Please follow these instructions carefully (unless otherwise directed):   On the Night Before the Test: Be sure to Drink plenty of water. Do not consume any caffeinated/decaffeinated beverages or chocolate 12 hours prior to your test. Do not take any antihistamines 12 hours prior to your test.  On the Day of the Test: Drink plenty of water until 1 hour prior to the test. Do not eat any food 4 hours prior to the test. You may take your regular medications prior to the test.  Take metoprolol (Lopressor) two hours prior to test. HOLD Furosemide morning of test. HOLD metoprolol succinate (Toprol XL) day of test. FEMALES- please wear underwire-free bra if available, avoid dresses & tight clothing       After the Test: Drink plenty of water. After receiving IV contrast, you may experience a mild flushed feeling. This is normal. On occasion, you may experience a mild rash up to 24 hours after the test. This is not dangerous. If this occurs, you can take Benadryl 25 mg and increase your fluid intake. If you experience trouble breathing, this can be serious. If it is severe call 911 IMMEDIATELY. If it is mild, please call our office. If you take any of these medications: Glipizide/Metformin, Avandament, Glucavance, please do not take 48 hours after completing test unless  otherwise instructed.  Please allow 2-4 weeks for scheduling of routine cardiac CTs. Some insurance companies require a pre-authorization which may delay scheduling of this test.   For non-scheduling related questions, please contact the cardiac imaging nurse navigator should you have any questions/concerns: Rockwell Alexandria, Cardiac Imaging Nurse Navigator Larey Brick, Cardiac Imaging Nurse Navigator [facility] Direct Office Dial: [phone]   For scheduling needs, including cancellations and rescheduling, please call [place], [phone].  ZIO XT- Long Term Monitor Instructions  Your physician has requested you wear a ZIO patch monitor for 3 days.  This is a single patch monitor. [employer] supplies one patch monitor per enrollment. Additional stickers are not available. Please do not apply patch if you will be having a Nuclear Stress Test,  Echocardiogram, Cardiac CT, MRI, or Chest Xray during the period you would be wearing the  monitor. The patch cannot be worn during these tests. You cannot remove and re-apply the  ZIO XT patch monitor.  Your ZIO patch monitor will be mailed 3 day USPS to your address on file. It may take 3-5 days  to receive your monitor after you have been enrolled.  Once you have received your monitor, please review the enclosed instructions. Your monitor  has already been registered assigning a specific monitor serial # to you.  Billing and Patient Assistance Program Information  We have supplied [employer] with any of your insurance information on file for billing purposes. [employer] offers a sliding scale Patient Assistance Program for patients that do not have  insurance, or whose insurance does not completely cover the cost of the ZIO monitor.  You must apply for the Patient Assistance Program to qualify for this discounted rate.  To apply, please call [employer] at [phone], select option 4, select option 2, ask to apply for   Patient Assistance Program. Meredeth Ide will ask your household income, and how many people  are in your household. They will quote your out-of-pocket cost based on that information.  [employer] will also be able to set up a 5-month, interest-free payment plan if needed.  Applying the monitor   Shave hair from upper left chest.  Hold abrader disc by orange tab. Rub abrader in 40 strokes over the upper left chest as  indicated in your monitor instructions.  Clean area with 4 enclosed alcohol pads. Let dry.  Apply patch as indicated in monitor instructions. Patch will be placed under collarbone on left  side of chest with arrow pointing upward.  Rub patch adhesive wings for 2 minutes. Remove white label marked "1". Remove the white  label marked "2". Rub patch adhesive wings for 2 additional minutes.  While looking in a mirror, press and release button in center of patch. A small green light will  flash 3-4 times. This will be your only indicator that the monitor has been turned on.  Do not shower for the first 24 hours. You may shower after the first 24 hours.  Press the button if you feel a symptom. You will hear a small click. Record Date, Time and  Symptom in the Patient Logbook.  When you are ready to remove the patch, follow instructions on the last 2 pages of Patient  Logbook. Stick patch monitor onto the last page of Patient Logbook.  Place Patient Logbook in the blue and white box. Use locking tab on box and tape box closed  securely. The blue and white box has prepaid postage on it. Please place it in the mailbox as  soon as possible. Your physician should have your test results approximately 7 days after the  monitor has been mailed back to [facility].  Call [facility] Customer Care at [phone] if you have questions regarding  your ZIO XT patch monitor. Call them immediately if you see an orange light blinking on your  monitor.  If your monitor falls off in less than 4  days, contact our Monitor department at [phone].  If your monitor becomes loose or falls off after 4 days call [employer] at [phone] for  suggestions on securing your monitor

## 2021-10-03 ENCOUNTER — Ambulatory Visit (INDEPENDENT_AMBULATORY_CARE_PROVIDER_SITE_OTHER): Payer: Managed Care, Other (non HMO)

## 2021-10-03 ENCOUNTER — Ambulatory Visit (INDEPENDENT_AMBULATORY_CARE_PROVIDER_SITE_OTHER): Payer: Self-pay | Admitting: Internal Medicine

## 2021-10-03 ENCOUNTER — Encounter: Payer: Self-pay | Admitting: Internal Medicine

## 2021-10-03 ENCOUNTER — Other Ambulatory Visit: Payer: Self-pay

## 2021-10-03 ENCOUNTER — Other Ambulatory Visit: Payer: Self-pay | Admitting: Internal Medicine

## 2021-10-03 VITALS — BP 132/80 | HR 69 | Ht 65.0 in | Wt 270.0 lb

## 2021-10-03 DIAGNOSIS — Z720 Tobacco use: Secondary | ICD-10-CM

## 2021-10-03 DIAGNOSIS — R002 Palpitations: Secondary | ICD-10-CM

## 2021-10-03 DIAGNOSIS — I2510 Atherosclerotic heart disease of native coronary artery without angina pectoris: Secondary | ICD-10-CM

## 2021-10-03 DIAGNOSIS — E785 Hyperlipidemia, unspecified: Secondary | ICD-10-CM

## 2021-10-03 DIAGNOSIS — Z01812 Encounter for preprocedural laboratory examination: Secondary | ICD-10-CM

## 2021-10-03 DIAGNOSIS — I10 Essential (primary) hypertension: Secondary | ICD-10-CM

## 2021-10-03 DIAGNOSIS — R072 Precordial pain: Secondary | ICD-10-CM

## 2021-10-03 MED ORDER — METOPROLOL TARTRATE 100 MG PO TABS: 100.0000 mg | ORAL_TABLET | Freq: Once | ORAL | 0 refills | Status: DC

## 2021-10-03 MED ORDER — EZETIMIBE 10 MG PO TABS: 10.0000 mg | ORAL_TABLET | Freq: Every day | ORAL | 3 refills | Status: DC

## 2021-10-03 NOTE — Progress Notes (Unsigned)
Patient enrolled for [employer] to mail a 3 day ZIO XT monitor to her address on file. 

## 2021-10-03 NOTE — Patient Instructions (Signed)
Medication Instructions:  Your physician has recommended you make the following change in your medication:  1.) ezetimibe (Zetia) 10 mg - one tablet daily  *If you need a refill on your cardiac medications before your next appointment, please call your pharmacy*   Lab Work: Today: BMET  If you have labs (blood work) drawn today and your tests are completely normal, you will receive your results only by: MyChart Message (if you have MyChart) OR A paper copy in the mail If you have any lab test that is abnormal or we need to change your treatment, we will call you to review the results.   Testing/Procedures: Cardiac CT - see instructions below.  Your physician has requested that you have an echocardiogram. Echocardiography is a painless test that uses sound waves to create images of your heart. It provides your doctor with information about the size and shape of your heart and how well your heart's chambers and valves are working. This procedure takes approximately one hour. There are no restrictions for this procedure.  Zio XT monitor - 3 days - see instructions below.   Follow-Up: You have been referred to PharmD for lipid management  At [facility], you and your health needs are our priority.  As part of our continuing mission to provide you with exceptional heart care, we have created designated Provider Care Teams.  These Care Teams include your primary Cardiologist (physician) and Advanced Practice Providers (APPs -  Physician Assistants and Nurse Practitioners) who all work together to provide you with the care you need, when you need it.  Your next appointment:   3 month(s)  The format for your next appointment:   In Person  Provider:   Orbie Pyo, MD     Other Instructions   Your cardiac CT will be scheduled at one of the below locations:   [facility] [address] [place], [place] [address] [phone]   Please arrive at the [facility] main entrance (entrance A) of [facility] 30 minutes prior to test start time. You can use the FREE valet parking offered at the main entrance (encouraged to control the heart rate for the test) Proceed to the [facility] Radiology Department (first floor) to check-in and test prep.  Please follow these instructions carefully (unless otherwise directed):   On the Night Before the Test: Be sure to Drink plenty of water. Do not consume any caffeinated/decaffeinated beverages or chocolate 12 hours prior to your test. Do not take any antihistamines 12 hours prior to your test.  On the Day of the Test: Drink plenty of water until 1 hour prior to the test. Do not eat any food 4 hours prior to the test. You may take your regular medications prior to the test.  Take metoprolol (Lopressor) two hours prior to test. HOLD Furosemide morning of test. HOLD metoprolol succinate (Toprol XL) day of test. FEMALES- please wear underwire-free bra if available, avoid dresses & tight clothing       After the Test: Drink plenty of water. After receiving IV contrast, you may experience a mild flushed feeling. This is normal. On occasion, you may experience a mild rash up to 24 hours after the test. This is not dangerous. If this occurs, you can take Benadryl 25 mg and increase your fluid intake. If you experience trouble breathing, this can be serious. If it is severe call 911 IMMEDIATELY. If it is mild, please call our office. If you  take any of these medications: Glipizide/Metformin, Avandament, Glucavance, please do not take 48 hours after completing test unless otherwise instructed.  Please allow 2-4 weeks for scheduling of routine cardiac CTs. Some insurance companies require a pre-authorization which may delay scheduling of this test.   For non-scheduling related questions, please contact the cardiac imaging nurse navigator should you have any questions/concerns: Rockwell Alexandria, Cardiac  Imaging Nurse Navigator Larey Brick, Cardiac Imaging Nurse Navigator [place] [facility] Direct Office Dial: [phone]   For scheduling needs, including cancellations and rescheduling, please call [place], [phone].  ZIO XT- Long Term Monitor Instructions  Your physician has requested you wear a ZIO patch monitor for 3 days.  This is a single patch monitor. [employer] supplies one patch monitor per enrollment. Additional stickers are not available. Please do not apply patch if you will be having a Nuclear Stress Test,  Echocardiogram, Cardiac CT, MRI, or Chest Xray during the period you would be wearing the  monitor. The patch cannot be worn during these tests. You cannot remove and re-apply the  ZIO XT patch monitor.  Your ZIO patch monitor will be mailed 3 day USPS to your address on file. It may take 3-5 days  to receive your monitor after you have been enrolled.  Once you have received your monitor, please review the enclosed instructions. Your monitor  has already been registered assigning a specific monitor serial # to you.  Billing and Patient Assistance Program Information  We have supplied [employer] with any of your insurance information on file for billing purposes. [employer] offers a sliding scale Patient Assistance Program for patients that do not have  insurance, or whose insurance does not completely cover the cost of the ZIO monitor.  You must apply for the Patient Assistance Program to qualify for this discounted rate.  To apply, please call [employer] at [phone], select option 4, select option 2, ask to apply for  Patient Assistance Program. Meredeth Ide will ask your household income, and how many people  are in your household. They will quote your out-of-pocket cost based on that information.  [employer] will also be able to set up a 76-month, interest-free payment plan if needed.  Applying the monitor   Shave hair from upper left chest.  Hold  abrader disc by orange tab. Rub abrader in 40 strokes over the upper left chest as  indicated in your monitor instructions.  Clean area with 4 enclosed alcohol pads. Let dry.  Apply patch as indicated in monitor instructions. Patch will be placed under collarbone on left  side of chest with arrow pointing upward.  Rub patch adhesive wings for 2 minutes. Remove white label marked "1". Remove the white  label marked "2". Rub patch adhesive wings for 2 additional minutes.  While looking in a mirror, press and release button in center of patch. A small green light will  flash 3-4 times. This will be your only indicator that the monitor has been turned on.  Do not shower for the first 24 hours. You may shower after the first 24 hours.  Press the button if you feel a symptom. You will hear a small click. Record Date, Time and  Symptom in the Patient Logbook.  When you are ready to remove the patch, follow instructions on the last 2 pages of Patient  Logbook. Stick patch monitor onto the last page of Patient Logbook.  Place Patient Logbook in the blue and white box. Use locking tab on box and  tape box closed  securely. The blue and white box has prepaid postage on it. Please place it in the mailbox as  soon as possible. Your physician should have your test results approximately 7 days after the  monitor has been mailed back to [facility].  Call [facility] Customer Care at [phone] if you have questions regarding  your ZIO XT patch monitor. Call them immediately if you see an orange light blinking on your  monitor.  If your monitor falls off in less than 4 days, contact our Monitor department at [phone].  If your monitor becomes loose or falls off after 4 days call [employer] at [phone] for  suggestions on securing your monitor

## 2021-10-04 LAB — BASIC METABOLIC PANEL
BUN/Creatinine Ratio: 15 (ref 12–28)
BUN: 14 mg/dL (ref 8–27)
CO2: 23 mmol/L (ref 20–29)
Calcium: 9.7 mg/dL (ref 8.7–10.3)
Chloride: 104 mmol/L (ref 96–106)
Creatinine, Ser: 0.91 mg/dL (ref 0.57–1.00)
Glucose: 89 mg/dL (ref 70–99)
Potassium: 4.9 mmol/L (ref 3.5–5.2)
Sodium: 142 mmol/L (ref 134–144)
eGFR: 70 mL/min/{1.73_m2} (ref >59)

## 2021-10-07 DIAGNOSIS — Z720 Tobacco use: Secondary | ICD-10-CM

## 2021-10-07 DIAGNOSIS — R002 Palpitations: Secondary | ICD-10-CM

## 2021-10-07 DIAGNOSIS — I10 Essential (primary) hypertension: Secondary | ICD-10-CM

## 2021-10-07 DIAGNOSIS — I2510 Atherosclerotic heart disease of native coronary artery without angina pectoris: Secondary | ICD-10-CM

## 2021-10-07 DIAGNOSIS — E785 Hyperlipidemia, unspecified: Secondary | ICD-10-CM

## 2021-10-07 DIAGNOSIS — R072 Precordial pain: Secondary | ICD-10-CM

## 2021-10-11 ENCOUNTER — Other Ambulatory Visit (HOSPITAL_COMMUNITY): Payer: Self-pay | Admitting: *Deleted

## 2021-10-11 ENCOUNTER — Telehealth (HOSPITAL_COMMUNITY): Payer: Self-pay | Admitting: *Deleted

## 2021-10-11 DIAGNOSIS — R072 Precordial pain: Secondary | ICD-10-CM

## 2021-10-11 DIAGNOSIS — Z720 Tobacco use: Secondary | ICD-10-CM

## 2021-10-11 DIAGNOSIS — E785 Hyperlipidemia, unspecified: Secondary | ICD-10-CM

## 2021-10-11 DIAGNOSIS — I10 Essential (primary) hypertension: Secondary | ICD-10-CM

## 2021-10-11 DIAGNOSIS — I2510 Atherosclerotic heart disease of native coronary artery without angina pectoris: Secondary | ICD-10-CM

## 2021-10-11 MED ORDER — METOPROLOL TARTRATE 100 MG PO TABS: 100.0000 mg | ORAL_TABLET | Freq: Once | ORAL | 0 refills | Status: AC

## 2021-10-11 NOTE — Telephone Encounter (Signed)
Reaching out to patient to offer assistance regarding upcoming cardiac imaging study; pt verbalizes understanding of appt date/time, parking situation and where to check in, pre-test NPO status and medications ordered, and verified current allergies; name and call back number provided for further questions should they arise  Larey Brick RN Navigator Cardiac Imaging [facility] [phone] office [phone] cell  Patient to take 100mg  metoprolol tartrate two hours prior to cardiac CT scan.  She is aware to arrive at 10:30am for her 11am CT scan.

## 2021-10-14 ENCOUNTER — Ambulatory Visit (HOSPITAL_COMMUNITY)
Admission: RE | Admit: 2021-10-14 | Discharge: 2021-10-14 | Disposition: A | Discharge status: Home / Self Care | Payer: Managed Care, Other (non HMO) | Source: Ambulatory Visit | Attending: Internal Medicine | Admitting: Internal Medicine

## 2021-10-14 ENCOUNTER — Other Ambulatory Visit: Payer: Self-pay

## 2021-10-14 DIAGNOSIS — Z720 Tobacco use: Secondary | ICD-10-CM | POA: Insufficient documentation

## 2021-10-14 DIAGNOSIS — I10 Essential (primary) hypertension: Secondary | ICD-10-CM | POA: Insufficient documentation

## 2021-10-14 DIAGNOSIS — R072 Precordial pain: Secondary | ICD-10-CM | POA: Insufficient documentation

## 2021-10-14 DIAGNOSIS — I2510 Atherosclerotic heart disease of native coronary artery without angina pectoris: Secondary | ICD-10-CM

## 2021-10-14 DIAGNOSIS — E785 Hyperlipidemia, unspecified: Secondary | ICD-10-CM | POA: Insufficient documentation

## 2021-10-14 MED ORDER — NITROGLYCERIN 0.4 MG SL SUBL
SUBLINGUAL_TABLET | SUBLINGUAL | Status: AC
  Filled 2021-10-14: qty 2

## 2021-10-14 MED ORDER — NITROGLYCERIN 0.4 MG SL SUBL
0.8000 mg | SUBLINGUAL_TABLET | Freq: Once | SUBLINGUAL | Status: AC
  Administered 2021-10-14: 0.8 mg via SUBLINGUAL

## 2021-10-14 MED ORDER — IOHEXOL 350 MG/ML SOLN
100.0000 mL | Freq: Once | INTRAVENOUS | Status: AC | PRN
  Administered 2021-10-14: 100 mL via INTRAVENOUS

## 2021-10-18 ENCOUNTER — Encounter: Payer: Self-pay | Admitting: Family

## 2021-10-18 ENCOUNTER — Encounter: Payer: Self-pay | Admitting: Internal Medicine

## 2021-10-19 ENCOUNTER — Other Ambulatory Visit: Payer: Self-pay | Admitting: Family

## 2021-10-19 DIAGNOSIS — M5432 Sciatica, left side: Secondary | ICD-10-CM

## 2021-10-19 DIAGNOSIS — I10 Essential (primary) hypertension: Secondary | ICD-10-CM

## 2021-10-19 DIAGNOSIS — M62838 Other muscle spasm: Secondary | ICD-10-CM

## 2021-10-19 MED ORDER — LISINOPRIL 20 MG PO TABS: 20.0000 mg | ORAL_TABLET | Freq: Every day | ORAL | 0 refills | Status: DC

## 2021-10-19 MED ORDER — GABAPENTIN 300 MG PO CAPS: ORAL_CAPSULE | ORAL | 0 refills | Status: DC

## 2021-10-19 MED ORDER — EZETIMIBE 10 MG PO TABS: 10.0000 mg | ORAL_TABLET | Freq: Every day | ORAL | 3 refills | Status: AC

## 2021-10-19 MED ORDER — METOPROLOL SUCCINATE ER 50 MG PO TB24: 50.0000 mg | ORAL_TABLET | Freq: Every day | ORAL | 0 refills | Status: DC

## 2021-10-20 ENCOUNTER — Other Ambulatory Visit: Payer: Self-pay | Admitting: Family

## 2021-10-20 DIAGNOSIS — G4700 Insomnia, unspecified: Secondary | ICD-10-CM

## 2021-10-20 MED ORDER — HYDROXYZINE PAMOATE 50 MG PO CAPS: 50.0000 mg | ORAL_CAPSULE | Freq: Every day | ORAL | 0 refills | Status: DC

## 2021-10-21 ENCOUNTER — Other Ambulatory Visit (HOSPITAL_COMMUNITY): Payer: Managed Care, Other (non HMO)

## 2021-10-24 ENCOUNTER — Ambulatory Visit (HOSPITAL_COMMUNITY): Payer: Self-pay | Attending: Cardiovascular Disease

## 2021-10-24 ENCOUNTER — Encounter (INDEPENDENT_AMBULATORY_CARE_PROVIDER_SITE_OTHER): Payer: Managed Care, Other (non HMO) | Admitting: Pharmacist

## 2021-10-24 ENCOUNTER — Other Ambulatory Visit: Payer: Self-pay

## 2021-10-24 DIAGNOSIS — I10 Essential (primary) hypertension: Secondary | ICD-10-CM | POA: Insufficient documentation

## 2021-10-24 DIAGNOSIS — E785 Hyperlipidemia, unspecified: Secondary | ICD-10-CM | POA: Insufficient documentation

## 2021-10-24 DIAGNOSIS — Z720 Tobacco use: Secondary | ICD-10-CM | POA: Insufficient documentation

## 2021-10-24 DIAGNOSIS — R072 Precordial pain: Secondary | ICD-10-CM | POA: Insufficient documentation

## 2021-10-24 DIAGNOSIS — I2510 Atherosclerotic heart disease of native coronary artery without angina pectoris: Secondary | ICD-10-CM | POA: Insufficient documentation

## 2021-10-24 LAB — ECHOCARDIOGRAM COMPLETE
Area-P 1/2: 2.87 cm2
S' Lateral: 3.2 cm

## 2021-10-24 NOTE — Progress Notes (Signed)
Patient ID: Deanna Dudley                 DOB: 1956-04-06                    MRN: [id number]     HPI: Kanitra Purifoy is a 65 y.o. female patient referred to lipid clinic by Dr. Lynnette Caffey. PMH is significant for mild obstructive CAD on cath in 2019, HTN, HLD and ongoing tobacco abuse. Has muscle cramps to statins. Was started on Zetia 10/03/21  Current Medications: zetia 10mg  daily Intolerances:  Risk Factors: tobacco use, HTN, CAD LDL goal:   Diet:   Exercise:   Family History:   Social History:   Labs: 08/24/21 TC 224, TG 283, HDL 30, LDL-C 142 (no medications)  Past Medical History:  Diagnosis Date   CAD (coronary artery disease), native coronary artery    s/p Angioplast only in 2011 @ [place]   Headache    Hyperlipidemia LDL goal <70    Hypertension    Hypertriglyceridemia 03/2020   Hypolipidemia 03/2020   Muscle spasm    Myocardial infarction [facility]) 2011   Syncope    Tobacco abuse    Vitamin A deficiency     Current Outpatient Medications on File Prior to Visit  Medication Sig Dispense Refill   acetaminophen (TYLENOL) 500 MG tablet Take 1 tablet (500 mg total) by mouth every 6 (six) hours as needed. 60 tablet 11   aspirin EC 81 MG tablet Take 1 tablet (81 mg total) by mouth daily. 90 tablet 3   ezetimibe (ZETIA) 10 MG tablet Take 1 tablet (10 mg total) by mouth daily. 90 tablet 3   furosemide (LASIX) 20 MG tablet Take 20 mg by mouth as needed.     gabapentin (NEURONTIN) 300 MG capsule Take 2 tablets (600 mg total) by mouth every morning and 3 tablets (900 mg total) by mouth every evening. 150 capsule 0   hydrOXYzine (VISTARIL) 50 MG capsule Take 1 capsule (50 mg total) by mouth at bedtime. 60 capsule 0   lisinopril (ZESTRIL) 20 MG tablet Take 1 tablet (20 mg total) by mouth daily. 30 tablet 0   meclizine (ANTIVERT) 25 MG tablet Take 1 tablet (25 mg total) by mouth 3 (three) times daily as needed for dizziness. 90 tablet 6   metoprolol succinate (TOPROL-XL) 50 MG 24 hr  tablet Take 1 tablet (50 mg total) by mouth daily. Take with or immediately following a meal. 30 tablet 0   metoprolol tartrate (LOPRESSOR) 100 MG tablet Take 1 tablet (100 mg total) by mouth once for 1 dose. Take 90-120 minutes prior to scan. 1 tablet 0   nitroGLYCERIN (NITROSTAT) 0.4 MG SL tablet Place 1 tablet (0.4 mg total) under the tongue every 5 (five) minutes as needed for chest pain. 100 tablet 3   No current facility-administered medications on file prior to visit.    Allergies  Allergen Reactions   Morphine And Related Hives and Shortness Of Breath   Statins Other (See Comments)    Muscle cramps   Xanax [Alprazolam]     Over sedated, possible sleep walking    Demerol [Meperidine]     Migraines    Imitrex [Sumatriptan]    Morphine And Related    Other     2012 Nuts - tongue swelling, mouth itching     Assessment/Plan:  1. Hyperlipidemia -    Thank you,   [place], Pharm.D, BCPS, CPP [facility]  [address], [place], [place] [address]  Phone: [phone]; Fax: [phone]

## 2021-11-10 NOTE — Progress Notes (Signed)
This encounter was created in error - please disregard.

## 2021-11-25 ENCOUNTER — Telehealth: Payer: Self-pay | Admitting: Family

## 2021-11-25 NOTE — Telephone Encounter (Signed)
Pt's spouse called to express many frustrations regarding pt's gabapentin and lisinopril refill requests being denied. 2 AVAILABLE options for Monday 11/28/21 were offered to see PCP to obtain refills but pt's spouse repeatedly refused-asking why can't PCP send med refills without an appt since she was in a few months ago ( last appt 08/24/21). Pt kept saying he was going to take it to [facility] and go to the Board [employer] because he was going to "loose it."  Pt asked to speak w/ a Production designer, theatre/television/film. Number to [facility] to speak w/ Manager Yvonna Alanis was provided.  Asked again about scheduling appt and pt's spouse hung up.  Please advise and thank you.

## 2021-11-28 ENCOUNTER — Encounter: Payer: Self-pay | Admitting: Internal Medicine

## 2021-11-28 NOTE — Telephone Encounter (Signed)
The patient was seen and had echo, cardiac cta ordered.  She also wore a monitor.  She has follow up in March.  Will route to Dr. Ali Lowe to confirm ok to work without restrictions from cardiac perspective.

## 2021-11-28 NOTE — Telephone Encounter (Signed)
Reviewed patient's CTA and monitor which both had reassuring findings. No further evaluation needed.  Low risk for perioperative cardiovascular complications.

## 2021-11-29 ENCOUNTER — Encounter: Payer: Self-pay | Admitting: *Deleted

## 2021-11-29 NOTE — Progress Notes (Signed)
The patient was seen and had echo, cardiac cta ordered.  She also wore a monitor.  She has follow up in March.  Will route to Dr. Lynnette Caffey to confirm ok to work without restrictions from cardiac perspective    Per message from Dr. Lynnette Caffey, pt can return to work.  No restrictions.

## 2021-11-30 ENCOUNTER — Other Ambulatory Visit: Payer: Self-pay | Admitting: Family

## 2021-11-30 DIAGNOSIS — M62838 Other muscle spasm: Secondary | ICD-10-CM

## 2021-11-30 DIAGNOSIS — I10 Essential (primary) hypertension: Secondary | ICD-10-CM

## 2021-11-30 DIAGNOSIS — M5432 Sciatica, left side: Secondary | ICD-10-CM

## 2021-11-30 MED ORDER — GABAPENTIN 300 MG PO CAPS: ORAL_CAPSULE | ORAL | 2 refills | Status: DC

## 2021-11-30 MED ORDER — LISINOPRIL 20 MG PO TABS: 20.0000 mg | ORAL_TABLET | Freq: Every day | ORAL | 0 refills | Status: DC

## 2021-11-30 NOTE — Telephone Encounter (Signed)
Patient does not understand why she is unable to get a refill on medications when she was just seen in October, 2022.  Requesting refills on medications for BP and Gabapentin.  She states she is having to space dosages out to relieve discomfort  and is ineffective towards pain.   She and husband states they are ready to escalate the matter because she does not understand the need for another appt.   Informed that request will be looked into and will call back.

## 2021-11-30 NOTE — Telephone Encounter (Signed)
Patient aware refills are sent to pharmacy.

## 2021-11-30 NOTE — Telephone Encounter (Unsigned)
Copied from [place] [id number]. Topic: General - Other >> Nov 25, 2021 11:46 AM Fields, Museum/gallery conservator R wrote: Reason for CRM: pt's husband, calling to speak with Selina Cooley about pt's medication. Seeking a call back asap [phone]

## 2021-12-09 ENCOUNTER — Other Ambulatory Visit: Payer: Self-pay | Admitting: Family

## 2021-12-09 DIAGNOSIS — I10 Essential (primary) hypertension: Secondary | ICD-10-CM

## 2022-01-18 ENCOUNTER — Encounter: Payer: Self-pay | Admitting: Internal Medicine

## 2022-01-23 ENCOUNTER — Ambulatory Visit: Payer: Self-pay | Admitting: Internal Medicine

## 2022-01-27 ENCOUNTER — Other Ambulatory Visit: Payer: Self-pay | Admitting: Family

## 2022-01-27 ENCOUNTER — Encounter: Payer: Self-pay | Admitting: Family

## 2022-01-27 DIAGNOSIS — G4700 Insomnia, unspecified: Secondary | ICD-10-CM

## 2022-01-27 DIAGNOSIS — I10 Essential (primary) hypertension: Secondary | ICD-10-CM

## 2022-01-30 ENCOUNTER — Telehealth: Payer: Self-pay | Admitting: Family

## 2022-01-30 ENCOUNTER — Telehealth: Payer: Self-pay

## 2022-01-30 NOTE — Telephone Encounter (Signed)
Att made to contact pt to schedule appt,  ?

## 2022-01-30 NOTE — Telephone Encounter (Signed)
Att to contact pt to schedule appointment no ans lvm to call office.  ?

## 2022-01-30 NOTE — Telephone Encounter (Signed)
Pt has been contacted by phone and Mychart  ?

## 2022-01-30 NOTE — Telephone Encounter (Signed)
Deanna Dudley, ?  ?We have made several attempts to reach by phone regarding scheduling an appointment for medication refills. Our office policy states you must have an appointment every 6 months for medication refills. Please reply to message with your preference of date and time of when you would like to come into the office. ?  ?Thank you ?

## 2022-02-13 ENCOUNTER — Ambulatory Visit: Payer: Self-pay

## 2022-02-13 NOTE — Telephone Encounter (Signed)
Nausea, vomiting, dizziness, home test negative for covid. Seeking appt today  ?Best contact: [phone]  ? ? ? ?Chief Complaint: Vomiting x 4 days ?Symptoms: Diarrhea started Sunday. Has dizziness. Keeping fluids down. ?Frequency: Thursday ?Pertinent Negatives: Patient denies fever ?Disposition: [] ED /[] Urgent Care (no appt availability in office) / [] Appointment(In office/virtual)/ []  [facility]/ [] Home Care/ [] Refused Recommended Disposition /[] [place] [facility]/ []  Follow-up with PCP ?Additional Notes: Pt. Asking to be worked in today. Please advise.  ?Answer Assessment - Initial Assessment Questions ?1. VOMITING SEVERITY: "How many times have you vomited in the past 24 hours?"  ?   - MILD:  1 - 2 times/day ?   - MODERATE: 3 - 5 times/day, decreased oral intake without significant weight loss or symptoms of dehydration ?   - SEVERE: 6 or more times/day, vomits everything or nearly everything, with significant weight loss, symptoms of dehydration  ?    Today just nausea ?2. ONSET: "When did the vomiting begin?"  ?    Last Thursday ?3. FLUIDS: "What fluids or food have you vomited up today?" "Have you been able to keep any fluids down?" ?    [place] ?4. ABDOMINAL PAIN: "Are your having any abdominal pain?" If yes : "How bad is it and what does it feel like?" (e.g., crampy, dull, intermittent, constant)  ?    No ?5. DIARRHEA: "Is there any diarrhea?" If Yes, ask: "How many times today?"  ?    Yes - 3 ?6. CONTACTS: "Is there anyone else in the family with the same symptoms?"  ?    No ?7. CAUSE: "What do you think is causing your vomiting?" ?    Unsure ?8. HYDRATION STATUS: "Any signs of dehydration?" (e.g., dry mouth [not only dry lips], too weak to stand) "When did you last urinate?" ?    No ?9. OTHER SYMPTOMS: "Do you have any other symptoms?" (e.g., fever, headache, vertigo, vomiting blood or coffee grounds, recent head injury) ?    Dizziness ?10. PREGNANCY: "Is there any chance you are  pregnant?" "When was your last menstrual period?" ?      No ? ?Protocols used: Vomiting-A-AH ? ?

## 2022-02-14 ENCOUNTER — Ambulatory Visit (HOSPITAL_BASED_OUTPATIENT_CLINIC_OR_DEPARTMENT_OTHER): Payer: Managed Care, Other (non HMO) | Admitting: Internal Medicine

## 2022-02-14 ENCOUNTER — Telehealth: Payer: Self-pay | Admitting: Family Medicine

## 2022-02-14 ENCOUNTER — Encounter: Payer: Self-pay | Admitting: Family Medicine

## 2022-02-14 DIAGNOSIS — R112 Nausea with vomiting, unspecified: Secondary | ICD-10-CM

## 2022-02-14 MED ORDER — ONDANSETRON HCL 4 MG PO TABS: 4.0000 mg | ORAL_TABLET | Freq: Three times a day (TID) | ORAL | 0 refills | Status: AC | PRN

## 2022-02-14 NOTE — Progress Notes (Signed)
E-Visit for Vomiting  We are sorry that you are not feeling well. Here is how we plan to help!  Based on what you have shared with me it looks like you have a Virus that is irritating your GI tract.  Vomiting is the forceful emptying of a portion of the stomach's content through the mouth.  Although nausea and vomiting can make you feel miserable, it's important to remember that these are not diseases, but rather symptoms of an underlying illness.  When we treat short term symptoms, we always caution that any symptoms that persist should be fully evaluated in a medical office.  I have prescribed a medication that will help alleviate your symptoms and allow you to stay hydrated:  Zofran 4 mg 1 tablet every 8 hours as needed for nausea and vomiting  HOME CARE: Drink clear liquids.  This is very important! Dehydration (the lack of fluid) can lead to a serious complication.  Start off with 1 tablespoon every 5 minutes for 8 hours. You may begin eating bland foods after 8 hours without vomiting.  Start with saltine crackers, white bread, rice, mashed potatoes, applesauce. After 48 hours on a bland diet, you may resume a normal diet. Try to go to sleep.  Sleep often empties the stomach and relieves the need to vomit.  GET HELP RIGHT AWAY IF:  Your symptoms do not improve or worsen within 2 days after treatment. You have a fever for over 3 days. You cannot keep down fluids after trying the medication.  MAKE SURE YOU:  Understand these instructions. Will watch your condition. Will get help right away if you are not doing well or get worse.   Thank you for choosing an e-visit.  Your e-visit answers were reviewed by a board certified advanced clinical practitioner to complete your personal care plan. Depending upon the condition, your plan could have included both over the counter or prescription medications.  Please review your pharmacy choice. Make sure the pharmacy is open so you can pick  up prescription now. If there is a problem, you may contact your provider through MyChart messaging and have the prescription routed to another pharmacy.  Your safety is important to us. If you have drug allergies check your prescription carefully.   For the next 24 hours you can use MyChart to ask questions about today's visit, request a non-urgent call back, or ask for a work or school excuse. You will get an email in the next two days asking about your experience. I hope that your e-visit has been valuable and will speed your recovery.  I provided 5 minutes of non face-to-face time during this encounter for chart review, medication and order placement, as well as and documentation.   

## 2022-02-21 NOTE — Progress Notes (Signed)
? ? ?Patient ID: Deanna Dudley, female    DOB: 12/07/1955  MRN: [id number] ? ?CC: Hypertension Follow-Up ? ?Subjective: ?Deanna Dudley is a 66 y.o. female who presents for hypertension follow-up.  ? ?Her concerns today include:  ?HYPERTENSION: ?Currently taking: see medication list ?Med Adherence: [x]  Yes    []  No ?Medication side effects: []  Yes    [x]  No ?Adherence with salt restriction (low-salt diet): [x]  Yes  []  No ?SOB? []  Yes    [x]  No ?Chest Pain?: []  Yes    [x]  No ?Comments: Reports when she checked her blood pressure at work recently was 190's/100's and thinks related to stress.  ? ?2. NAUSEA AND VOMITING FOLLOW-UP: ?02/14/2022 with Tereasa CoopHannah Mills[facility]: ?Based on what you have shared with me it looks like you have a Virus that is irritating your GI tract.  Vomiting is the forceful emptying of a portion of the stomach's content through the mouth.  Although nausea and vomiting can make you feel miserable, it's important to remember that these are not diseases, but rather symptoms of an underlying illness.  When we treat short term symptoms, we always caution that any symptoms that persist should be fully evaluated in a medical office. ?  ?I have prescribed a medication that will help alleviate your symptoms and allow you to stay hydrated: ?  ?Zofran 4 mg 1 tablet every 8 hours as needed for nausea and vomiting ? ?02/22/2022: ?Reports nausea, vomiting, and diarrhea persisting for 2 weeks. Initially went to Urgent Care and was told a 3 hour wait so left. Subsequently seen by e-Visit (see above). Prescribed Zofran which helped. Last time vomited was 4 days ago, yellow bile. Denies chest pain, shortness of breath and additional red flag symptoms. However, reports chest does feel congested stating "I think I may have aspiration pneumonia." Diarrhea happens at least 30 minutes after eating. Able to keep down ginger ale and saltines. Has lost 12 pounds. Has taken two Covid tests out of office and  reports negative. Feels weak with balance off. Does have history of Vitamin D deficiency.  ? ?3. SCIATICA FOLLOW-UP: ?4. MUSCLE SPASMS FOLLOW-UP: ?Requesting refills of Gabapentin. No issues/concerns. ? ?5. INSOMNIA FOLLOW-UP: ?Requesting refills of Vistaril. No issues/concerns. Also, helps with seasonal allergy itching.  ? ? ?Patient Active Problem List  ? Diagnosis Date Noted  ? Insomnia 10/20/2021  ? Prediabetes 08/25/2021  ? Hyperlipidemia 08/25/2021  ? Nonintractable headache 03/25/2020  ? Bilateral sciatica 03/13/2019  ? Dizziness 03/13/2019  ? Angina pectoris (HCC)   ? Paresthesia 02/05/2018  ? Hypertension   ? Muscle spasm   ? Myocardial infarction (HCC) 11/20/2009  ?  ? ?Current Outpatient Medications on File Prior to Visit  ?Medication Sig Dispense Refill  ? acetaminophen (TYLENOL) 500 MG tablet Take 1 tablet (500 mg total) by mouth every 6 (six) hours as needed. 60 tablet 11  ? aspirin EC 81 MG tablet Take 1 tablet (81 mg total) by mouth daily. 90 tablet 3  ? ezetimibe (ZETIA) 10 MG tablet Take 1 tablet (10 mg total) by mouth daily. 90 tablet 3  ? furosemide (LASIX) 20 MG tablet Take 20 mg by mouth as needed.    ? meclizine (ANTIVERT) 25 MG tablet Take 1 tablet (25 mg total) by mouth 3 (three) times daily as needed for dizziness. 90 tablet 6  ? metoprolol tartrate (LOPRESSOR) 100 MG tablet Take 1 tablet (100 mg total) by mouth once for 1 dose. Take 90-120 minutes prior  to scan. 1 tablet 0  ? nitroGLYCERIN (NITROSTAT) 0.4 MG SL tablet Place 1 tablet (0.4 mg total) under the tongue every 5 (five) minutes as needed for chest pain. 100 tablet 3  ? ondansetron (ZOFRAN) 4 MG tablet Take 1 tablet (4 mg total) by mouth every 8 (eight) hours as needed for nausea or vomiting. 15 tablet 0  ? ?No current facility-administered medications on file prior to visit.  ? ? ?Allergies  ?Allergen Reactions  ? Morphine And Related Hives and Shortness Of Breath  ? Statins Other (See Comments)  ?  Muscle cramps  ? Xanax  [Alprazolam]   ?  Over sedated, possible sleep walking   ? Demerol [Meperidine]   ?  Migraines   ? Imitrex [Sumatriptan]   ? Morphine And Related   ? Other   ?  [place] Nuts - tongue swelling, mouth itching   ? ? ?Social History  ? ?Socioeconomic History  ? Marital status: Married  ?  Spouse name: Not on file  ? Number of children: Not on file  ? Years of education: Not on file  ? Highest education level: Not on file  ?Occupational History  ? Not on file  ?Tobacco Use  ? Smoking status: Every Day  ?  Types: Cigarettes  ?  Passive exposure: Never  ? Smokeless tobacco: Never  ?Vaping Use  ? Vaping Use: Never used  ?Substance and Sexual Activity  ? Alcohol use: Yes  ?  Alcohol/week: 1.0 standard drink  ?  Types: 1 Cans of beer per week  ? Drug use: Never  ? Sexual activity: Yes  ?Other Topics Concern  ? Not on file  ?Social History Narrative  ? ** Merged History Encounter **  ?    ? ?Social Determinants of Health  ? ?Financial Resource Strain: Not on file  ?Food Insecurity: Not on file  ?Transportation Needs: Not on file  ?Physical Activity: Not on file  ?Stress: Not on file  ?Social Connections: Not on file  ?Intimate Partner Violence: Not on file  ? ? ?Family History  ?Problem Relation Age of Onset  ? Stroke Mother   ? Colon cancer Mother   ? Lung cancer Mother   ? Hypothyroidism Father   ? Diabetes Father   ? CAD Father   ? Heart disease Maternal Grandfather   ? Clotting disorder Maternal Grandfather   ? Heart disease Paternal Grandmother   ? ? ?Past Surgical History:  ?Procedure Laterality Date  ? ABDOMINAL HYSTERECTOMY  1983  ? APPENDECTOMY    ? Age 69  ? BREAST SURGERY Bilateral   ? Removed ducts from both breast  ? CHOLECYSTECTOMY  1995  ? RIGHT/LEFT HEART CATH AND CORONARY ANGIOGRAPHY N/A 02/06/2018  ? Procedure: RIGHT/LEFT HEART CATH AND CORONARY ANGIOGRAPHY;  Surgeon: Corky Crafts, MD;  Location: [facility] INVASIVE CV LAB;  Service: Cardiovascular;  Laterality: N/A;  ? SHOULDER SURGERY Right   ? Rotater cuff  repair  ? TONSILLECTOMY    ? Age 21  ? ? ?ROS: ?Review of Systems ?Negative except as stated above ? ?PHYSICAL EXAM: ?BP 139/80 (BP Location: Left Arm, Patient Position: Sitting, Cuff Size: Large)   Pulse 66   Temp 98.3 ?F (36.8 ?C)   Resp 18   Ht 5\' 5"  (1.651 m)   Wt 262 lb (118.8 kg)   SpO2 95%   BMI 43.60 kg/m?  ? ?Physical Exam ?HENT:  ?   Head: Normocephalic and atraumatic.  ?Eyes:  ?  Extraocular Movements: Extraocular movements intact.  ?   Conjunctiva/sclera: Conjunctivae normal.  ?   Pupils: Pupils are equal, round, and reactive to light.  ?Cardiovascular:  ?   Rate and Rhythm: Normal rate and regular rhythm.  ?   Pulses: Normal pulses.  ?   Heart sounds: Normal heart sounds.  ?Pulmonary:  ?   Effort: Pulmonary effort is normal.  ?   Breath sounds: Normal breath sounds.  ?Musculoskeletal:  ?   Cervical back: Normal range of motion and neck supple.  ?Neurological:  ?   General: No focal deficit present.  ?   Mental Status: She is alert and oriented to person, place, and time.  ?Psychiatric:     ?   Mood and Affect: Mood normal.     ?   Behavior: Behavior normal.  ? ?ASSESSMENT AND PLAN: ?1. Essential (primary) hypertension: ?- Continue Lisinopril and Metoprolol Succinate as prescribed. Counseled on medication adherence and adverse effects. ?- Counseled on blood pressure goal of less than 140/90, low-sodium, DASH diet, medication compliance, 150 minutes of moderate intensity exercise per week as tolerated. Discussed medication compliance, adverse effects. ?- Update BMP.  ?- Patient initially referred to Cardiology 09/27/2021 for aortic calcification. ?- Last appointment with Cardiology on 10/03/2021 with Alverda Skeans, MD . Patient was seen for coronary artery disease, hyperlipidemia, hypertension, tobacco abuse and precordial pain. The recommendation was to follow-up in 3 months. Upon review of chart patient canceled appointment scheduled 01/23/2022 with Alverda Skeans, MD. Also, patient canceled  appointment scheduled 02/14/2022 with Zoila Shutter, MD at Cardiology. Counseled patient on the importance of keeping all scheduled appointments with Cardiology until she has been medically cleared by the same. Patient stat

## 2022-02-22 ENCOUNTER — Ambulatory Visit (INDEPENDENT_AMBULATORY_CARE_PROVIDER_SITE_OTHER): Payer: Self-pay

## 2022-02-22 ENCOUNTER — Other Ambulatory Visit: Payer: Self-pay

## 2022-02-22 ENCOUNTER — Ambulatory Visit (INDEPENDENT_AMBULATORY_CARE_PROVIDER_SITE_OTHER): Payer: Self-pay | Admitting: Family

## 2022-02-22 ENCOUNTER — Encounter: Payer: Self-pay | Admitting: Family

## 2022-02-22 VITALS — BP 139/80 | HR 66 | Temp 98.3°F | Resp 18 | Ht 65.0 in | Wt 262.0 lb

## 2022-02-22 DIAGNOSIS — G4700 Insomnia, unspecified: Secondary | ICD-10-CM

## 2022-02-22 DIAGNOSIS — M62838 Other muscle spasm: Secondary | ICD-10-CM

## 2022-02-22 DIAGNOSIS — M5431 Sciatica, right side: Secondary | ICD-10-CM

## 2022-02-22 DIAGNOSIS — M5432 Sciatica, left side: Secondary | ICD-10-CM

## 2022-02-22 DIAGNOSIS — R112 Nausea with vomiting, unspecified: Secondary | ICD-10-CM

## 2022-02-22 DIAGNOSIS — Z1211 Encounter for screening for malignant neoplasm of colon: Secondary | ICD-10-CM

## 2022-02-22 DIAGNOSIS — R197 Diarrhea, unspecified: Secondary | ICD-10-CM

## 2022-02-22 DIAGNOSIS — I10 Essential (primary) hypertension: Secondary | ICD-10-CM

## 2022-02-22 DIAGNOSIS — Z1321 Encounter for screening for nutritional disorder: Secondary | ICD-10-CM

## 2022-02-22 MED ORDER — GABAPENTIN 300 MG PO CAPS: ORAL_CAPSULE | ORAL | 2 refills | Status: DC

## 2022-02-22 MED ORDER — HYDROXYZINE PAMOATE 50 MG PO CAPS: 50.0000 mg | ORAL_CAPSULE | Freq: Every day | ORAL | 3 refills | Status: AC

## 2022-02-22 MED ORDER — METOPROLOL SUCCINATE ER 50 MG PO TB24: 50.0000 mg | ORAL_TABLET | Freq: Every day | ORAL | 3 refills | Status: DC

## 2022-02-22 MED ORDER — HYDROXYZINE PAMOATE 50 MG PO CAPS: 50.0000 mg | ORAL_CAPSULE | Freq: Every day | ORAL | 3 refills | Status: DC

## 2022-02-22 MED ORDER — LISINOPRIL 20 MG PO TABS: 20.0000 mg | ORAL_TABLET | Freq: Every day | ORAL | 3 refills | Status: AC

## 2022-02-22 MED ORDER — LISINOPRIL 20 MG PO TABS: 20.0000 mg | ORAL_TABLET | Freq: Every day | ORAL | 3 refills | Status: DC

## 2022-02-22 MED ORDER — OMEPRAZOLE 20 MG PO CPDR: 20.0000 mg | DELAYED_RELEASE_CAPSULE | Freq: Every day | ORAL | 1 refills | Status: AC

## 2022-02-22 NOTE — Progress Notes (Signed)
Pt presents for nausea, vomiting and diarrhea that started last Thursday has kind of tapered off but pt still feeling bad  ?

## 2022-02-23 ENCOUNTER — Other Ambulatory Visit: Payer: Self-pay | Admitting: Family

## 2022-02-23 DIAGNOSIS — M479 Spondylosis, unspecified: Secondary | ICD-10-CM

## 2022-02-23 LAB — BASIC METABOLIC PANEL
BUN/Creatinine Ratio: 16 (ref 12–28)
BUN: 13 mg/dL (ref 8–27)
CO2: 24 mmol/L (ref 20–29)
Calcium: 9.4 mg/dL (ref 8.7–10.3)
Chloride: 104 mmol/L (ref 96–106)
Creatinine, Ser: 0.79 mg/dL (ref 0.57–1.00)
Glucose: 101 mg/dL — ABNORMAL HIGH (ref 70–99)
Potassium: 4.8 mmol/L (ref 3.5–5.2)
Sodium: 141 mmol/L (ref 134–144)
eGFR: 83 mL/min/{1.73_m2} (ref >59)

## 2022-02-23 LAB — VITAMIN D 25 HYDROXY (VIT D DEFICIENCY, FRACTURES): Vit D, 25-Hydroxy: 18.6 ng/mL — ABNORMAL LOW (ref 30.0–100.0)

## 2022-02-23 NOTE — Progress Notes (Signed)
No definite evidence of acute cardiopulmonary disease.  ? ?Referral to Orthopedics for degeneration changes of spine. Expect a call within a few weeks with appointment details.

## 2022-02-23 NOTE — Progress Notes (Signed)
Kidney function and electrolytes normal.

## 2022-02-24 LAB — COVID-19, FLU A+B AND RSV
Influenza A, NAA: NOT DETECTED
Influenza B, NAA: NOT DETECTED
RSV, NAA: NOT DETECTED
SARS-CoV-2, NAA: NOT DETECTED

## 2022-02-24 NOTE — Progress Notes (Signed)
Covid, Flu, RSV negative.

## 2022-06-04 ENCOUNTER — Other Ambulatory Visit: Payer: Self-pay | Admitting: Family

## 2022-06-04 DIAGNOSIS — M62838 Other muscle spasm: Secondary | ICD-10-CM

## 2022-06-04 DIAGNOSIS — M5431 Sciatica, right side: Secondary | ICD-10-CM

## 2022-06-05 NOTE — Telephone Encounter (Signed)
Order complete. 

## 2022-06-16 ENCOUNTER — Other Ambulatory Visit: Payer: Self-pay | Admitting: Family

## 2022-06-16 DIAGNOSIS — I10 Essential (primary) hypertension: Secondary | ICD-10-CM

## 2022-06-18 NOTE — Telephone Encounter (Signed)
Managed per Cardiology.

## 2022-06-30 ENCOUNTER — Other Ambulatory Visit: Payer: Self-pay | Admitting: Family

## 2022-06-30 DIAGNOSIS — I10 Essential (primary) hypertension: Secondary | ICD-10-CM

## 2022-07-01 ENCOUNTER — Encounter: Payer: Self-pay | Admitting: Family

## 2022-07-01 ENCOUNTER — Other Ambulatory Visit: Payer: Self-pay | Admitting: Family

## 2022-07-01 DIAGNOSIS — I10 Essential (primary) hypertension: Secondary | ICD-10-CM

## 2022-07-03 ENCOUNTER — Telehealth: Payer: Self-pay | Admitting: Family

## 2022-07-03 NOTE — Telephone Encounter (Signed)
Pt had OV 02/22/22, within 6 months, will refill medication.  Requested Prescriptions  Pending Prescriptions Disp Refills  . metoprolol succinate (TOPROL-XL) 50 MG 24 hr tablet [Pharmacy Med Name: Metoprolol Succinate ER 50 MG Oral Tablet Extended Release 24 Hour] 30 tablet 0    Sig: TAKE 1 TABLET BY MOUTH ONCE DAILY WITH A MEAL     Cardiovascular:  Beta Blockers Passed - 07/01/2022 10:50 AM      Passed - Last BP in normal range    BP Readings from Last 1 Encounters:  02/22/22 139/80         Passed - Last Heart Rate in normal range    Pulse Readings from Last 1 Encounters:  02/22/22 66         Passed - Valid encounter within last 6 months    Recent Outpatient Visits          4 months ago Essential (primary) hypertension   Primary Care at [facility], Amy J, NP   10 months ago Annual physical exam   Primary Care at [facility], Amy J, NP   10 months ago Encounter to establish care   Primary Care at [facility], Salomon Fick, NP

## 2022-07-17 ENCOUNTER — Other Ambulatory Visit: Payer: Self-pay | Admitting: Family

## 2022-07-17 DIAGNOSIS — G4700 Insomnia, unspecified: Secondary | ICD-10-CM

## 2022-07-18 NOTE — Telephone Encounter (Signed)
Requested medication (s) are due for refill today: yes  Requested medication (s) are on the active medication list: no  Last refill:  02/22/22   Future visit scheduled: no  Notes to clinic:  Medication expired 06/22/22   Requested Prescriptions  Pending Prescriptions Disp Refills   hydrOXYzine (VISTARIL) 50 MG capsule [Pharmacy Med Name: hydrOXYzine Pamoate 50 MG Oral Capsule] 30 capsule 0    Sig: Take 1 capsule by mouth at bedtime     Ear, Nose, and Throat:  Antihistamines 2 Passed - 07/17/2022  9:13 AM      Passed - Cr in normal range and within 360 days    Creatinine, Ser  Date Value Ref Range Status  02/22/2022 0.79 0.57 - 1.00 mg/dL Final         Passed - Valid encounter within last 12 months    Recent Outpatient Visits           4 months ago Essential (primary) hypertension   Primary Care at [facility], Amy J, NP   10 months ago Annual physical exam   Primary Care at [facility], Amy J, NP   11 months ago Encounter to establish care   Primary Care at [facility], Salomon Fick, NP

## 2022-09-06 ENCOUNTER — Other Ambulatory Visit: Payer: Self-pay | Admitting: Family

## 2022-09-06 DIAGNOSIS — M62838 Other muscle spasm: Secondary | ICD-10-CM

## 2022-09-06 DIAGNOSIS — M5431 Sciatica, right side: Secondary | ICD-10-CM

## 2022-09-06 NOTE — Telephone Encounter (Signed)
Requested Prescriptions  Pending Prescriptions Disp Refills  . gabapentin (NEURONTIN) 300 MG capsule [Pharmacy Med Name: Gabapentin 300 MG Oral Capsule] 450 capsule 0    Sig: TAKE 2 CAPSULES BY MOUTH IN THE MORNING AND 3 IN THE EVENING     Neurology: Anticonvulsants - gabapentin Passed - 09/06/2022  9:16 AM      Passed - Cr in normal range and within 360 days    Creatinine, Ser  Date Value Ref Range Status  02/22/2022 0.79 0.57 - 1.00 mg/dL Final         Passed - Completed PHQ-2 or PHQ-9 in the last 360 days      Passed - Valid encounter within last 12 months    Recent Outpatient Visits          6 months ago Essential (primary) hypertension   Primary Care at [facility], [place], NP   1 year ago Annual physical exam   Primary Care at [facility], Amy J, NP   1 year ago Encounter to establish care   Primary Care at [facility], Flonnie Hailstone, NP

## 2023-07-26 NOTE — Telephone Encounter (Signed)
See routing comment(s) for message information.

## 2024-01-08 IMAGING — DX DG CHEST 2V
2 series · 2 of 2 positions shown · non-contrast
Comparison: 11/10/2020

CLINICAL DATA: 65-year-old female with cough

EXAM:
CHEST - 2 VIEW

[chest pa]
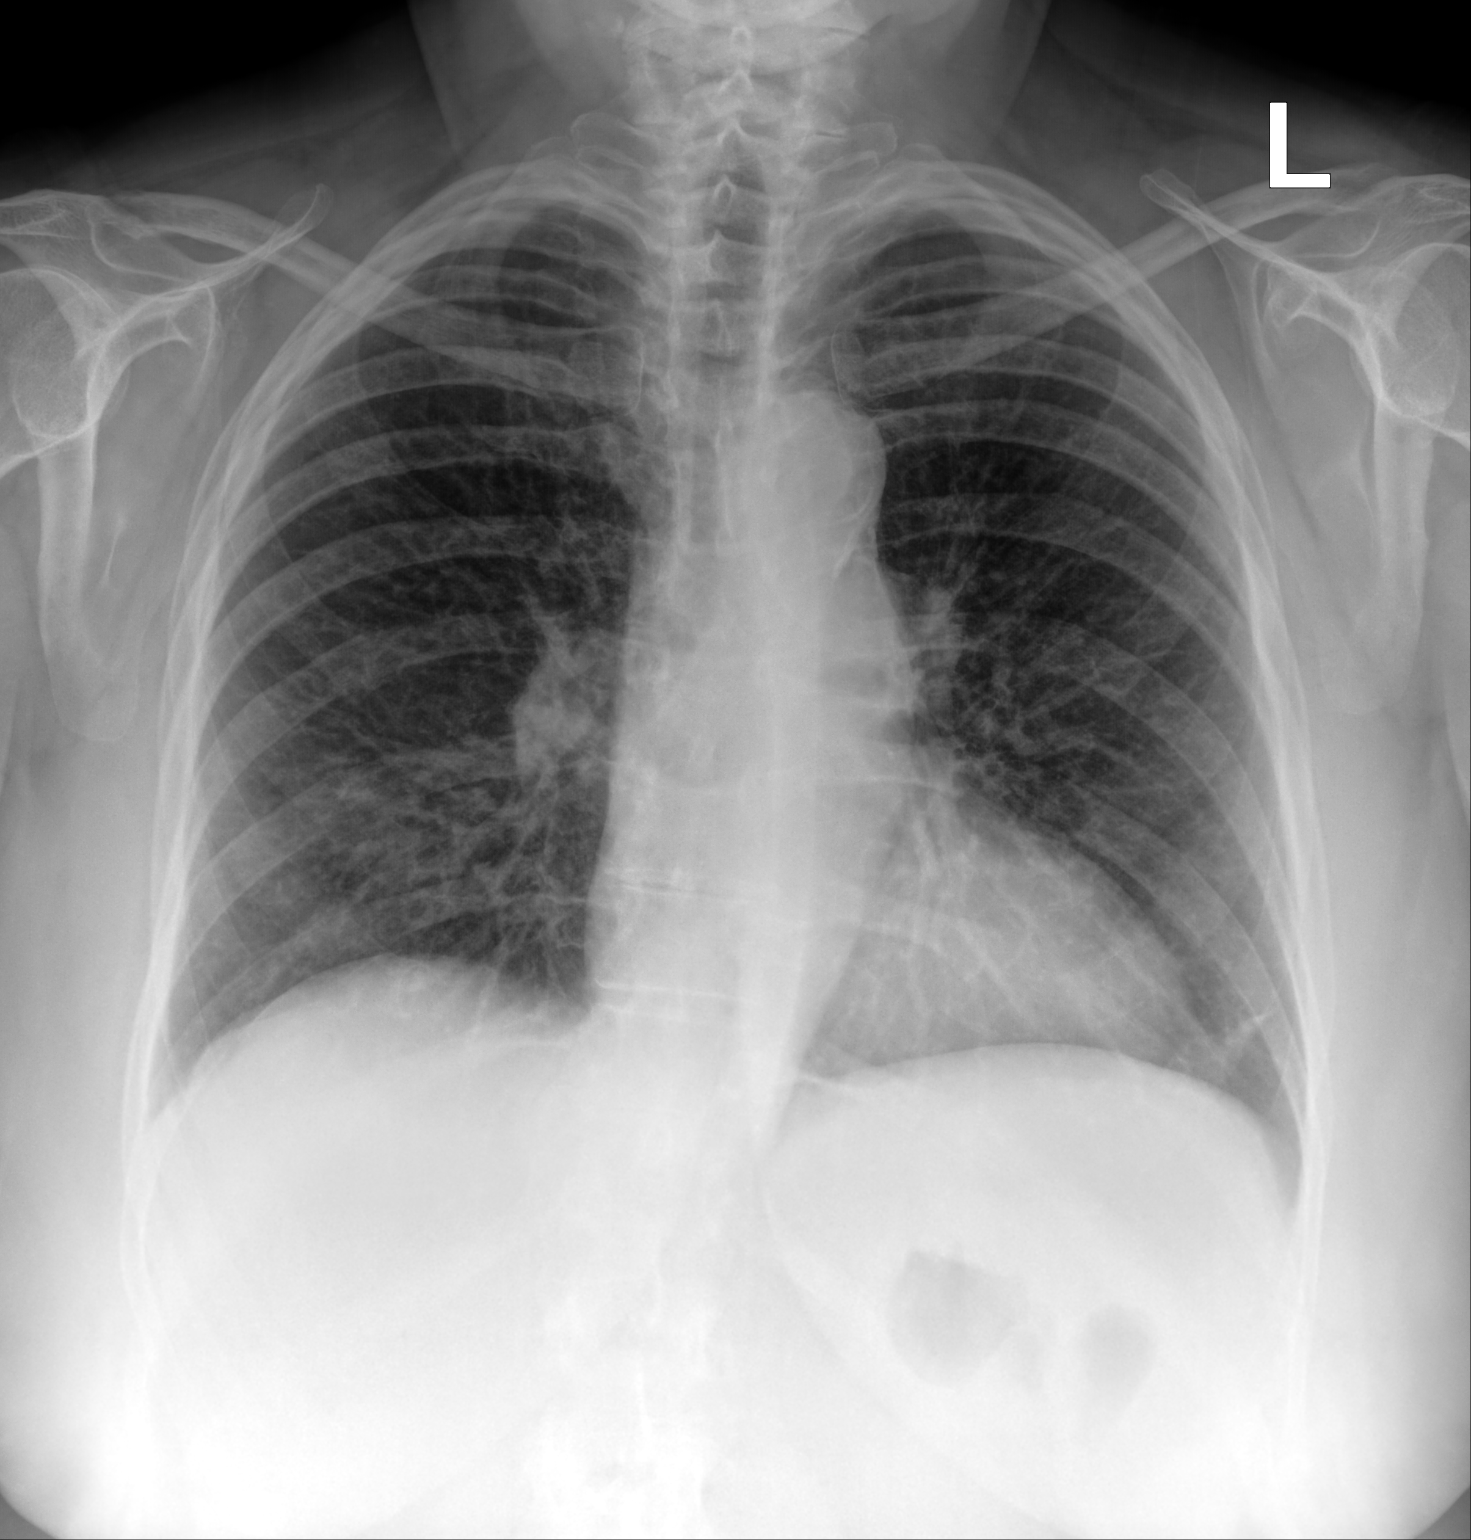

[chest lat]
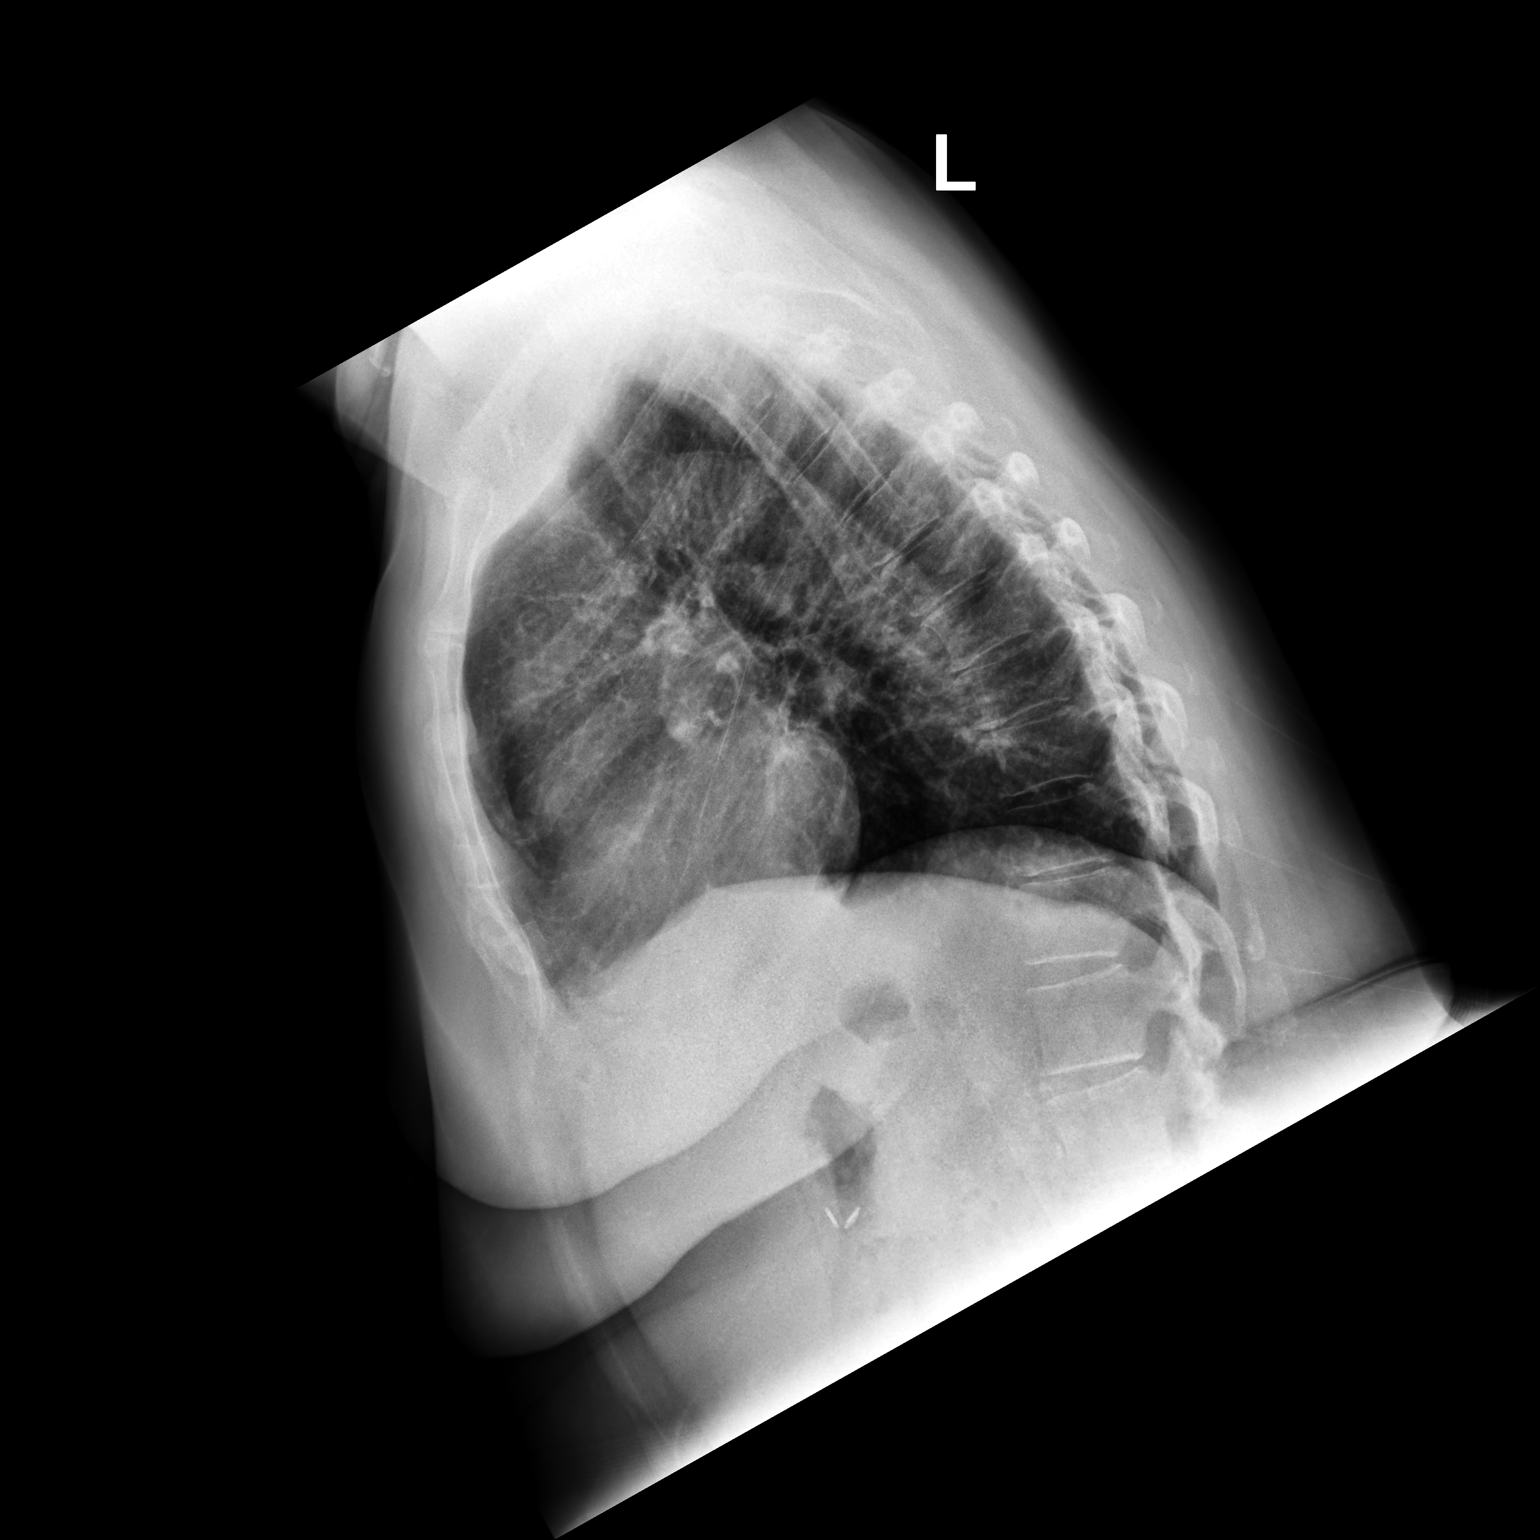

[2 of 2 positions shown; findings below may reference images not displayed]

FINDINGS: Cardiomediastinal silhouette unchanged in size and contour. No
evidence of central vascular congestion. No interlobular septal
thickening.

No pneumothorax or pleural effusion. Coarsened interstitial
markings, with no confluent airspace disease.

No acute displaced fracture. Degenerative changes of the spine.
IMPRESSION: No definite evidence of acute cardiopulmonary disease

## 2024-01-18 ENCOUNTER — Other Ambulatory Visit: Payer: Self-pay | Admitting: Family

## 2024-01-18 DIAGNOSIS — L819 Disorder of pigmentation, unspecified: Secondary | ICD-10-CM

## 2024-02-12 ENCOUNTER — Ambulatory Visit
Admission: RE | Admit: 2024-02-12 | Discharge: 2024-02-12 | Disposition: A | Discharge status: Home / Self Care | Payer: Self-pay | Source: Ambulatory Visit | Attending: Family | Admitting: Family

## 2024-02-12 DIAGNOSIS — L819 Disorder of pigmentation, unspecified: Secondary | ICD-10-CM
